# Patient Record
Sex: Female | Born: 1950 | Race: White | Hispanic: No | Marital: Married | Smoking: Never smoker
Health system: Southern US, Community
[De-identification: ages and names within clinical notes are randomized; demographics above are authoritative.]

## PROBLEM LIST (undated history)

## (undated) DIAGNOSIS — I10 Essential (primary) hypertension: Secondary | ICD-10-CM

## (undated) DIAGNOSIS — M1712 Unilateral primary osteoarthritis, left knee: Secondary | ICD-10-CM

## (undated) DIAGNOSIS — M7750 Other enthesopathy of unspecified foot: Secondary | ICD-10-CM

## (undated) DIAGNOSIS — M20019 Mallet finger of unspecified finger(s): Secondary | ICD-10-CM

## (undated) HISTORY — PX: FOOT SURGERY: SHX648

## (undated) HISTORY — DX: Unilateral primary osteoarthritis, left knee: M17.12

## (undated) HISTORY — DX: Mallet finger of unspecified finger(s): M20.019

## (undated) HISTORY — DX: Essential (primary) hypertension: I10

## (undated) HISTORY — DX: Other enthesopathy of unspecified foot and ankle: M77.50

---

## 1999-07-20 ENCOUNTER — Other Ambulatory Visit: Admission: RE | Admit: 1999-07-20 | Discharge: 1999-07-20 | Payer: Self-pay | Admitting: Obstetrics and Gynecology

## 2000-07-23 ENCOUNTER — Other Ambulatory Visit: Admission: RE | Admit: 2000-07-23 | Discharge: 2000-07-23 | Payer: Self-pay | Admitting: Obstetrics and Gynecology

## 2001-08-20 ENCOUNTER — Other Ambulatory Visit: Admission: RE | Admit: 2001-08-20 | Discharge: 2001-08-20 | Payer: Self-pay | Admitting: Obstetrics and Gynecology

## 2003-09-14 ENCOUNTER — Other Ambulatory Visit: Admission: RE | Admit: 2003-09-14 | Discharge: 2003-09-14 | Payer: Self-pay | Admitting: Obstetrics and Gynecology

## 2009-06-23 ENCOUNTER — Ambulatory Visit: Payer: Self-pay | Admitting: Sports Medicine

## 2009-06-23 DIAGNOSIS — M654 Radial styloid tenosynovitis [de Quervain]: Secondary | ICD-10-CM | POA: Insufficient documentation

## 2009-06-23 DIAGNOSIS — M25539 Pain in unspecified wrist: Secondary | ICD-10-CM | POA: Insufficient documentation

## 2009-12-08 ENCOUNTER — Ambulatory Visit: Payer: Self-pay | Admitting: Sports Medicine

## 2009-12-08 DIAGNOSIS — M719 Bursopathy, unspecified: Secondary | ICD-10-CM

## 2009-12-08 DIAGNOSIS — M67919 Unspecified disorder of synovium and tendon, unspecified shoulder: Secondary | ICD-10-CM | POA: Insufficient documentation

## 2010-07-15 ENCOUNTER — Ambulatory Visit
Admission: RE | Admit: 2010-07-15 | Discharge: 2010-07-15 | Payer: Self-pay | Source: Home / Self Care | Attending: Family Medicine | Admitting: Family Medicine

## 2010-07-15 ENCOUNTER — Encounter: Payer: Self-pay | Admitting: Family Medicine

## 2010-07-15 DIAGNOSIS — M6530 Trigger finger, unspecified finger: Secondary | ICD-10-CM | POA: Insufficient documentation

## 2010-08-02 NOTE — Assessment & Plan Note (Signed)
Summary: SHOULDER PAIN,MC   History of Present Illness: Insidious onset right shoulder pain. Worst on overhead motions, pushups, and planks. Relieved by position change, advil, and rest. Again, no inciting incident. No prior shoulder injury or procedures. No neck pain or paresthesias.  Right DeQuervain's much improved.  Wearing splint during activities such as yoga. Still some localized pain during yoga.  Allergies: No Known Drug Allergies PMH-FH-SH reviewed for relevance  Physical Exam  General:  Well-developed,well-nourished,in no acute distress; alert,appropriate and cooperative throughout examination Msk:  NEURO: (-) Spurling's bilaterally. Nl neurologic exam throughout upper extremities.  NECK: No spinal ttp. FROM without pain.  SHOULDERS: No atrophy. No swelilng or discoloration. Full ROM. Full strength. (+) R Hawkin's. (-) Empty Can. (-) Speed's/Yerguson's. No signs of labral instability or pain.  ELBOWS: Full ROM. Full strength.  WRISTS/HANDS/FINGERS: Full ROM. Full strength. 2+ rad pulses. (+) Finkelstein's on the right with decreased pain since LOV.    Impression & Recommendations:  Problem # 1:  ROTATOR CUFF SYNDROME, RIGHT (ICD-726.10)  - Daily theraband RC rehab exercises via provided handout. - Stop advil. - Start mobic. - RTC in 4 wks. - No pushups or overhead lifting. Minimize pushups.  Problem # 2:  DE QUERVAIN'S TENOSYNOVITIS (ICD-727.04) Assessment: Improved Declined corticosteroid injection.  - Start mobic. - Continue current plan. - Avoid aggravating activities/positions.  Complete Medication List: 1)  Mobic 15 Mg Tabs (Meloxicam) .Marland Kitchen.. 1 tab by mouth with food daily Prescriptions: MOBIC 15 MG TABS (MELOXICAM) 1 tab by mouth with food daily  #30 x 1   Entered and Authorized by:   Valarie Merino MD   Signed by:   Valarie Merino MD on 12/08/2009   Method used:   Print then Give to Patient   RxID:   [id number]

## 2010-08-04 NOTE — Op Note (Signed)
Summary: Consent  Consent   Imported By: Marily Memos 07/19/2010 08:54:56  _____________________________________________________________________  External Attachment:    Type:   Image     Comment:   External Document

## 2010-08-04 NOTE — Assessment & Plan Note (Signed)
Summary: TENDONITIS R WRIST,TRIGGER FINGER L HAND,MC   Vital Signs:  Patient profile:   60 year old female Height:      66 inches Weight:      125 pounds BMI:     20.25 BP sitting:   132 / 83  Vitals Entered By: Lillia Pauls CMA (July 15, 2010 9:20 AM)  History of Present Illness: 1) continued RIGHt wrist pain prev dx as de quervains. Got a littl better for a while then has returned. pain with everyday activities like throwing a stick for her labrador. No numbness. No wekaness. no new injury never had wrist or hand surgery has intermittently worn the thumb spica splint.  new problem of left thumb "sticking". Noted for first time earlier in week.no injury. No n ew activity. Right hand dominant  Current Medications (verified): 1)  Mobic 15 Mg Tabs (Meloxicam) .Marland Kitchen.. 1 Tab By Mouth With Food Daily  Allergies: No Known Drug Allergies  Review of Systems       Please see HPI for additional ROS.   Physical Exam  General:  alert, well-developed, well-nourished, and well-hydrated.   Msk:  right thumb abductor tendons ttp. FROM. normal strength. left thumb trigger seen with flexion. ttp at  mcp joint thumb Additional Exam:  Patient given informed consent for injection. Discussed possible complications of infection, bleeding or skin atrophy at site of injection. Possible side effect of avascular necrosis (focal area of bone death) due to steroid use.Appropriate verbal time out taken Are cleaned and prepped in usual sterile fashion. A --1-- cc kennalog plus --1--cc 1% lidocaine without epinephrine was injected into the--tendon sheath thumb-. Patient tolerated procedure well with no complications.  Patient given informed consent for injection. Discussed possible complications of infection, bleeding or skin atrophy at site of injection. Possible side effect of avascular necrosis (focal area of bone death) due to steroid use.Appropriate verbal time out taken Are cleaned and prepped in usual  sterile fashion. A ---1/2- cc kennalog plus 1/2----cc 1% lidocaine without epinephrine was injected into the--tendon nodule thumb flexor-. Patient tolerated procedure well with no complications.    Impression & Recommendations:  Problem # 1:  DE QUERVAIN'S TENOSYNOVITIS (ICD-727.04)  injection universal thumb splint at this time she has had signs for over a year. 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 1 tab by mouth with food daily   Orders Added: 1)  Est. Patient Level IV [09811] 2)  Joint Aspirate / Injection, Small [20600] 3)  Kenalog 10 mg inj [J3301]

## 2010-08-05 ENCOUNTER — Encounter: Payer: Self-pay | Admitting: Family Medicine

## 2010-08-05 ENCOUNTER — Ambulatory Visit: Admit: 2010-08-05 | Payer: Self-pay | Admitting: Family Medicine

## 2010-08-05 ENCOUNTER — Ambulatory Visit (INDEPENDENT_AMBULATORY_CARE_PROVIDER_SITE_OTHER): Payer: Commercial Managed Care - PPO | Admitting: Family Medicine

## 2010-08-05 DIAGNOSIS — M6530 Trigger finger, unspecified finger: Secondary | ICD-10-CM

## 2010-08-05 DIAGNOSIS — M654 Radial styloid tenosynovitis [de Quervain]: Secondary | ICD-10-CM

## 2010-08-18 NOTE — Assessment & Plan Note (Signed)
Summary: FU WRIST/FINGER/MC/MJD   Vital Signs:  Patient profile:   60 year old female BP sitting:   131 / 82  Vitals Entered By: Judge Stall (August 05, 2010 9:27 AM)  History of Present Illness: 60 yo female following up for right Dequervins and left thumb trigger finger s/p injections.  Pt states she is feeling 100 times better  1.  Derenda Mis-  Pt states she still wear her brace from time to time but it does get in the way of some ADL's.  Pt states the brace does not help her with her yoga and that she used to have another brace that was longer that helped her.   2.  Trigger finger no new problems only catches very seldomnley.  No pain no swelling, no redness.   Current Medications (verified): 1)  Mobic 15 Mg Tabs (Meloxicam) .Marland Kitchen.. 1 Tab By Mouth With Food Daily  Allergies (verified): No Known Drug Allergies  Past History:  Past medical, surgical, family and social histories (including risk factors) reviewed, and no changes noted (except as noted below).  Family History: Reviewed history from 06/23/2009 and no changes required. Mother- Arthritis of unkonwn type  Social History: Reviewed history from 06/23/2009 and no changes required. No tobacco use  Physical Exam  General:  alert, well-developed, well-nourished, and well-hydrated.   Msk:  FROM of both thumbs Right thumb, negative finklsteins no deformity NVI, FROM Left thumb trigger point/ calcification felt near base of thumb, NT, no catching appreciated FROM NVI.   Impression & Recommendations:  Problem # 1:  DE QUERVAIN'S TENOSYNOVITIS (ICD-727.04) Assessment Improved Continue splint at bedtime as needed for pain relief, given longer wrist brace with thumb spica for yoga.  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   Complete Medication List: 1)  Mobic 15 Mg Tabs (Meloxicam)  .Marland Kitchen.. 1 tab by mouth with food daily [facility]   Orders Added: 1)  Wrist splint cock up- [facility] [L3908] 2)  Est. Patient Level III [60454]     Complete Medication List: 1)  Mobic 15 Mg Tabs (Meloxicam) .Marland Kitchen.. 1 tab by mouth with food daily  [facility]

## 2011-01-24 ENCOUNTER — Other Ambulatory Visit: Payer: Self-pay | Admitting: Obstetrics & Gynecology

## 2011-01-24 DIAGNOSIS — Z1231 Encounter for screening mammogram for malignant neoplasm of breast: Secondary | ICD-10-CM

## 2011-02-22 ENCOUNTER — Ambulatory Visit (HOSPITAL_COMMUNITY)
Admission: RE | Admit: 2011-02-22 | Discharge: 2011-02-22 | Disposition: A | Discharge status: Home / Self Care | Payer: 59 | Source: Ambulatory Visit | Attending: Obstetrics & Gynecology | Admitting: Obstetrics & Gynecology

## 2011-02-22 DIAGNOSIS — Z1231 Encounter for screening mammogram for malignant neoplasm of breast: Secondary | ICD-10-CM

## 2011-07-14 ENCOUNTER — Ambulatory Visit (INDEPENDENT_AMBULATORY_CARE_PROVIDER_SITE_OTHER): Payer: 59 | Admitting: Family Medicine

## 2011-07-14 VITALS — BP 128/80

## 2011-07-14 DIAGNOSIS — M6530 Trigger finger, unspecified finger: Secondary | ICD-10-CM

## 2011-07-14 DIAGNOSIS — M65312 Trigger thumb, left thumb: Secondary | ICD-10-CM

## 2011-07-14 NOTE — Patient Instructions (Signed)
Thumb spica alleve 2tab po bid for 7 days Warm hand soaks Ice massage F/u 08/04/11. If not better cortisone injection will be done

## 2011-07-14 NOTE — Progress Notes (Signed)
  Subjective:    Patient ID: Sonya Luna, female    DOB: 02/07/1951, 61 y.o.   MRN: [id number]  HPI  Ms. Dimiceli is coming to f/u on her left trigger thumb. She had a cortisone injection last year that lasted for a year. She is having mild snapping of her flexor tendon with flexion and extension but is mild. She os not having really pain but mild discomfort. She was wondering if she needed to repeat another cortisone injection at the present time. Her right wrist de Quervain's  Tenosynovitis has a resolved.  Patient Active Problem List  Diagnoses  . WRIST PAIN, RIGHT  . ROTATOR CUFF SYNDROME, RIGHT  . DE QUERVAIN'S TENOSYNOVITIS  . TRIGGER FINGER, LEFT THUMB   No current outpatient prescriptions on file prior to visit.   Allergies no known allergies   Review of Systems  Constitutional: Negative for fever, chills, diaphoresis and fatigue.  Musculoskeletal: Negative for back pain, joint swelling, arthralgias and gait problem.  Neurological: Negative for weakness and numbness.       Objective:   Physical Exam  Constitutional: She is oriented to person, place, and time. She appears well-developed and well-nourished.       BP 128/80   Pulmonary/Chest: Effort normal.  Musculoskeletal:       Left with intact skin. No swelling of the thumb. This mild snapping sensation with flexion and extension at the A1 pulley corresponding with flexor tendon tenosynovitis. Neurovascularly intact. For range of motion of the first left MPJ  Neurological: She is alert and oriented to person, place, and time.  Skin: Skin is warm. No rash noted. No erythema.  Psychiatric: She has a normal mood and affect. Her behavior is normal.          Assessment & Plan:   1. Trigger thumb of left hand    Thumb spica alleve 2tab po bid for 7 days Warm hand soaks Ice massage F/u 08/04/11. If not better cortisone injection will be done

## 2011-08-04 ENCOUNTER — Ambulatory Visit (INDEPENDENT_AMBULATORY_CARE_PROVIDER_SITE_OTHER): Payer: 59 | Admitting: Family Medicine

## 2011-08-04 VITALS — BP 120/80

## 2011-08-04 DIAGNOSIS — M6530 Trigger finger, unspecified finger: Secondary | ICD-10-CM

## 2011-08-04 NOTE — Progress Notes (Signed)
  Subjective:    Patient ID: Sonya Luna, female    DOB: [birth year], 61 y.o.   MRN: [id number]  HPI  Followup left trigger thumb. Last year we injected it and she had significant resolution until about a month ago. Acutely her symptoms came back. Was seen here and started on exercises and thumb splint. If anything her symptoms seem worse. Would like to consider steroid injection today.  Review of Systems    denies fever, sweats, chills. Has seen no redness or erythema of the left thumb. Objective:   Physical Exam   Vital signs reviewed. GENERAL: Well developed, well nourished, no acute distress THUMB: Definite triggering of the flexor tendon at the MCP joint. She is neurovascularly intact in her hands. Bilaterally her hand exam is otherwise normal.     INJECTION: Patient was given informed consent, signed copy in the chart. Appropriate time out was taken. Area prepped and draped in usual sterile fashion. 1/2 cc of methylprednisolone 40 mg/ml plus  1/2 cc of 1% lidocaine without epinephrine was injected into the sheath of thumb tendon using a(n) volar approach. The patient tolerated the procedure well. There were no complications. Post procedure instructions were given.  Assessment & Plan:  Trigger thumb. Corticosteroid injection. If this does not help then I would probably recommend orthopedic evaluation. If this helps for significant amount nothing will both be happy

## 2012-01-23 ENCOUNTER — Other Ambulatory Visit: Payer: Self-pay | Admitting: Obstetrics & Gynecology

## 2012-01-23 DIAGNOSIS — Z1231 Encounter for screening mammogram for malignant neoplasm of breast: Secondary | ICD-10-CM

## 2012-03-13 ENCOUNTER — Ambulatory Visit (HOSPITAL_COMMUNITY)
Admission: RE | Admit: 2012-03-13 | Discharge: 2012-03-13 | Disposition: A | Discharge status: Home / Self Care | Payer: 59 | Source: Ambulatory Visit | Attending: Obstetrics & Gynecology | Admitting: Obstetrics & Gynecology

## 2012-03-13 DIAGNOSIS — Z1231 Encounter for screening mammogram for malignant neoplasm of breast: Secondary | ICD-10-CM | POA: Insufficient documentation

## 2012-07-15 ENCOUNTER — Ambulatory Visit (INDEPENDENT_AMBULATORY_CARE_PROVIDER_SITE_OTHER): Payer: 59 | Admitting: Family Medicine

## 2012-07-15 VITALS — BP 151/85 | Ht 66.0 in | Wt 126.0 lb

## 2012-07-15 DIAGNOSIS — IMO0001 Reserved for inherently not codable concepts without codable children: Secondary | ICD-10-CM

## 2012-07-15 DIAGNOSIS — M20019 Mallet finger of unspecified finger(s): Secondary | ICD-10-CM

## 2012-07-15 NOTE — Progress Notes (Signed)
  Subjective:    Patient ID: Sonya Luna, female    DOB: [birth year], 62 y.o.   MRN: [id number]  HPI  Right long finger injury this morning as she was trying to put on a pair of boots. Felt a small pop and now can no longer straighten the knee and part of her finger. No numbness. No prior problems with this finger.  Review of Systems No Sully pain in the joint, no erythema, no numbness.    Objective:   Physical Exam  Vital signs are reviewed GENERAL: Well-developed female no acute distress HAND: Right. Index finger DIP is in flexion. She can not fully extend at the DIP. ; Small evulsion seen of the extensor tendon.      Assessment & Plan:  #1. Extensor tendon injury right long finger. We placed her in a stack splint and gave her instructions on 6-8 weeks of extension.  At 6 weeks she can evaluate herself her return to clinic and will be happy to evaluate for her.Marland Kitchen

## 2013-02-18 ENCOUNTER — Other Ambulatory Visit: Payer: Self-pay | Admitting: Obstetrics & Gynecology

## 2013-02-18 DIAGNOSIS — Z1231 Encounter for screening mammogram for malignant neoplasm of breast: Secondary | ICD-10-CM

## 2013-03-26 ENCOUNTER — Ambulatory Visit (HOSPITAL_COMMUNITY)
Admission: RE | Admit: 2013-03-26 | Discharge: 2013-03-26 | Disposition: A | Discharge status: Home / Self Care | Payer: 59 | Source: Ambulatory Visit | Attending: Obstetrics & Gynecology | Admitting: Obstetrics & Gynecology

## 2013-03-26 ENCOUNTER — Other Ambulatory Visit: Payer: Self-pay | Admitting: Obstetrics & Gynecology

## 2013-03-26 DIAGNOSIS — Z1231 Encounter for screening mammogram for malignant neoplasm of breast: Secondary | ICD-10-CM | POA: Insufficient documentation

## 2013-03-31 ENCOUNTER — Other Ambulatory Visit: Payer: Self-pay | Admitting: Obstetrics & Gynecology

## 2013-03-31 DIAGNOSIS — N63 Unspecified lump in unspecified breast: Secondary | ICD-10-CM

## 2013-04-11 ENCOUNTER — Ambulatory Visit
Admission: RE | Admit: 2013-04-11 | Discharge: 2013-04-11 | Disposition: A | Discharge status: Home / Self Care | Payer: 59 | Source: Ambulatory Visit | Attending: Obstetrics & Gynecology | Admitting: Obstetrics & Gynecology

## 2013-04-11 DIAGNOSIS — N63 Unspecified lump in unspecified breast: Secondary | ICD-10-CM

## 2013-08-20 ENCOUNTER — Ambulatory Visit: Payer: Self-pay | Admitting: Podiatry

## 2013-08-20 ENCOUNTER — Ambulatory Visit (INDEPENDENT_AMBULATORY_CARE_PROVIDER_SITE_OTHER): Payer: 59

## 2013-08-20 ENCOUNTER — Ambulatory Visit (INDEPENDENT_AMBULATORY_CARE_PROVIDER_SITE_OTHER): Payer: 59 | Admitting: Podiatry

## 2013-08-20 ENCOUNTER — Encounter: Payer: Self-pay | Admitting: Podiatry

## 2013-08-20 VITALS — BP 138/91 | HR 88 | Resp 16

## 2013-08-20 DIAGNOSIS — M2020 Hallux rigidus, unspecified foot: Secondary | ICD-10-CM

## 2013-08-21 NOTE — Progress Notes (Signed)
Subjective:     Patient ID: Sonya Luna, female   DOB: [birth year], 63 y.o.   MRN: [id number]  HPI patient presents stating my left big toe joint is starting to bother me more and I know I will probably need surgery on it. My right is doing well he does not been that much but there is no pain or deformity   Review of Systems     Objective:   Physical Exam Neurovascular status intact with no change in patient's health history since last visit. Muscle strength adequate and no equinus condition and good Fill time is noted to all digits on both feet. First MPJ left shows diminishment range of motion with approximate 15-20 of dorsiflexion 10 of plantar flexion with obvious spurs dorsal and medial on the first metatarsal head. 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 Discussed the condition on the left foot and she would like a hallux limitus repair. I did discuss my concerns because of the right one whether or not this one will do well with biplanar osteotomy but she is satisfied with the right one and would like the same procedure she had gone on back foot. Patient reviewed a biplanar Austin osteotomy with pin fixation and reviewed all complications as outlined in the consent form. She understands total recovery from procedure such as this is approximate 6 months to one year and she will be in a boot for the first week to 10 days followed by surgical shoe usage for another 3-4 weeks. Patient had air fracture walker dispensed for left and signed consent form after extensive review and is scheduled for surgery next week due to her schedule

## 2014-03-02 ENCOUNTER — Other Ambulatory Visit: Payer: Self-pay | Admitting: Obstetrics & Gynecology

## 2014-03-02 DIAGNOSIS — Z0000 Encounter for general adult medical examination without abnormal findings: Secondary | ICD-10-CM

## 2014-03-02 DIAGNOSIS — Z1231 Encounter for screening mammogram for malignant neoplasm of breast: Secondary | ICD-10-CM

## 2014-04-01 ENCOUNTER — Ambulatory Visit (HOSPITAL_COMMUNITY): Payer: 59

## 2014-04-08 ENCOUNTER — Ambulatory Visit (HOSPITAL_COMMUNITY)
Admission: RE | Admit: 2014-04-08 | Discharge: 2014-04-08 | Disposition: A | Discharge status: Home / Self Care | Payer: 59 | Source: Ambulatory Visit | Attending: Obstetrics & Gynecology | Admitting: Obstetrics & Gynecology

## 2014-04-08 DIAGNOSIS — Z1231 Encounter for screening mammogram for malignant neoplasm of breast: Secondary | ICD-10-CM | POA: Insufficient documentation

## 2014-05-12 ENCOUNTER — Encounter: Payer: Self-pay | Admitting: Sports Medicine

## 2014-05-12 ENCOUNTER — Ambulatory Visit (INDEPENDENT_AMBULATORY_CARE_PROVIDER_SITE_OTHER): Payer: 59 | Admitting: Sports Medicine

## 2014-05-12 VITALS — BP 153/93 | HR 60 | Ht 66.0 in | Wt 130.0 lb

## 2014-05-12 DIAGNOSIS — S46811A Strain of other muscles, fascia and tendons at shoulder and upper arm level, right arm, initial encounter: Secondary | ICD-10-CM

## 2014-05-12 DIAGNOSIS — M75111 Incomplete rotator cuff tear or rupture of right shoulder, not specified as traumatic: Secondary | ICD-10-CM | POA: Insufficient documentation

## 2014-05-12 DIAGNOSIS — M7521 Bicipital tendinitis, right shoulder: Secondary | ICD-10-CM

## 2014-05-12 MED ORDER — NITROGLYCERIN 0.2 MG/HR TD PT24: MEDICATED_PATCH | TRANSDERMAL | Status: DC

## 2014-05-12 NOTE — Assessment & Plan Note (Signed)
Less than 50% tear, 2 microcalcifications seen in tendon. -Start NTG protocol -Therapeutic exercises -Follow-up in 6 weeks for re-US or sooner if needed.

## 2014-05-12 NOTE — Progress Notes (Signed)
   Subjective:    Patient ID: Sonya Luna, female    DOB: Aug 21, 1950, 63 y.o.   MRN: [id number]  HPI [name] is a 63 year old right-hand-dominant female who presents with right shoulder pain. Onset of symptoms was 2 days ago when she was standing to start her lawnmower and she felt sudden pain located in the anterior right shoulder. Symptoms are aggravated with reaching above shoulder level or reaching behind her. She has tried rest, ice, and Aleve with some relief of symptoms. She denies any pop in the right shoulder, weakness, or numbness. She has had similar episodes in the remote past, which all resolved with some rest and gentle exercises. She denies any associated swelling, bruising, warmth, or fevers. She is usually fairly active with exercise classes and yoga.  Past medical history, social history, medications, and allergies were reviewed and are up to date in the chart.  rReview of Systems 7 point review of systems was performed and was otherwise negative unless noted in the history of present illness.     Objective:   Physical Exam BP 153/93 mmHg  Pulse 60  Ht 5\' 6"  (1.676 m)  Wt 130 lb (58.968 kg)  BMI 20.99 kg/m2 GEN: The patient is well-developed well-nourished female and in no acute distress.  She is awake alert and oriented x3. SKIN: warm and well-perfused, no rash  Neuro: Strength 5/5 globally. Sensation intact throughout. No focal deficits. Vasc: +2 bilateral distal pulses. No edema.  MSK:  Examination of the right shoulder reveals forward flexion and abduction to 90 with pain at the endpoints of motion. She has internal rotation and extension to the level of T10. Rotator cuff testing reveals weakness with Jobes testing, and speeds test. She also has positive impingement signs. Negative abdominal compression test. No tenderness at the a.c. Joint. She has no Popeye deformity. She is neurovascularly intact distally.  Limited musculoskeletal ultrasound: long and  short axis views were obtained of the right shoulder. There is a hypoechoic fluid collection surrounding the biceps tendon, but no frank tear is seen in the tendon itself. The tendon was able to be tracked proximally into the glenohumeral joint. Examination of the subscapularis, infraspinatus, and teres major were otherwise normal. She does have a 40% partial thickness tear of her supraspinatus with 2 microcalcifications seen overlying the foot plate and surrounding fluid edema.      Assessment & Plan:  Please see problem based assessment and plan in the problem list.

## 2014-05-12 NOTE — Assessment & Plan Note (Signed)
-  With hypoechoic fluid collection. Spur seen at humeral head where biceps tracks as well. -Start NTG protocol -Therapeutic exercises -Follow-up in 6 weeks for re-US or sooner if needed.

## 2014-05-12 NOTE — Patient Instructions (Signed)
Dx: Partial thickness biceps tendon tear with fluid around it. Also, possibly previous partial supraspinatus tear with 2 microcalcifications seen on ultrasound.  Nitroglycerin Protocol   Apply 1/4 nitroglycerin patch to affected area daily.  Change position of patch within the affected area every 24 hours.  You may experience a headache during the first 1-2 weeks of using the patch, these should subside.  If you experience headaches after beginning nitroglycerin patch treatment, you may take your preferred over the counter pain reliever.  Another side effect of the nitroglycerin patch is skin irritation or rash related to patch adhesive.  Please notify our office if you develop more severe headaches or rash, and stop the patch.  Tendon healing with nitroglycerin patch may require 12 to 24 weeks depending on the extent of injury.  Men should not use if taking Viagra, Cialis, or Levitra.   Do not use if you have migraines or rosacea.   -Start Nitroglycerin patch once daily -Exercises:  1. "Lawnmower" rows, with no more than 3-5lb weight. 10-15 reps x 2-3 sets once daily  2. Spokes on a wheel from 0-90 degrees 10-15 reps x 2-3 sets once daily.  3. Palm up/Palm down with 3-5lb weight with elbow against your side and elbow flexed to 90 degrees. -Rest, ice, Aleve if needed -Follow-up in 6 weeks for repeat US and recheck or sooner if needed.

## 2014-06-23 ENCOUNTER — Ambulatory Visit (INDEPENDENT_AMBULATORY_CARE_PROVIDER_SITE_OTHER): Payer: 59 | Admitting: Sports Medicine

## 2014-06-23 ENCOUNTER — Encounter: Payer: Self-pay | Admitting: Sports Medicine

## 2014-06-23 VITALS — BP 142/95 | HR 73 | Ht 66.0 in | Wt 130.0 lb

## 2014-06-23 DIAGNOSIS — M75111 Incomplete rotator cuff tear or rupture of right shoulder, not specified as traumatic: Secondary | ICD-10-CM

## 2014-06-23 DIAGNOSIS — M7521 Bicipital tendinitis, right shoulder: Secondary | ICD-10-CM

## 2014-06-23 NOTE — Assessment & Plan Note (Signed)
This looks improved on scanning and exam  Will cont HEP

## 2014-06-23 NOTE — Assessment & Plan Note (Signed)
Mild improvement. Exam similar today with improved ROM, but persistent weakness due to pain. US demonstrates more involved tearing of rotator cuff with partial tearing involvement of 3 muscles: supraspinatus, subscapularis, and teres minor. Biceps tendonitis mostly resolved.  Plan:  1. Continue current conservative rehab - half NTG patches, gentle exercises with light curls and forearm rolls (limit ROM flexion to 45 to 90*, avoid pain)  2. Anticipate total rehab / recovery 3 to 6 months given multiple muscles involved  3. Avoid future str training with kettle bell and advise against pushing through similar pain if returns to str training classes  4. RTC 6 weeks re-evaluation

## 2014-06-23 NOTE — Progress Notes (Signed)
Subjective:    Patient ID: Sonya Luna, female    DOB: 1950/12/09, 63 y.o.   MRN: [id number]  HPI  RIGHT SHOULD PAIN / ROTATOR CUFF: - Patient presents for a follow-up for same complaint, last [facility] visit 05/12/14, at that time diagnosed with partial tear rotator cuff tear (<50% of supraspinatus) and biceps tendinitis on [place], given NTG patches, exercises. - Today (approx 6 weeks later) patient with some improvement, but complains of persistent pain and limitation with activity without overall significant resolution. Admits to pain even with lifting coffee cup with Right arm (needs to use left hand to support), some improved motion but pain worse with lifting arm forward over head, continued to use NTG patches inc from quarter to half patch, continued exercises. Discussed initial injury, believes most likely related to strength training class with chest presses few days before actual acute pain when started lawnmower, admits she has been "pushing through pain" with the class for a while, including exercises with kettle bell - Denies any numbness, tingling, worsening pain Note almost all of her night pain has resolved Ability to lift things to shoulder height is better but not higher than that  I have reviewed and updated the following as appropriate: allergies and current medications  Social Hx: - never smoker  Review of Systems  See above HPI    Objective:   Physical Exam  BP 142/95 mmHg  Pulse 73  Ht 5\' 6"  (1.676 m)  Wt 130 lb (58.968 kg)  BMI 20.99 kg/m2  Gen - well-appearing, NAD MSK: - Right Shoulder: appearance normal and symmetrical to Left, no effusions or edema, no obvious deformities, mostly full active ROM with some slight limitation in forward flexion >100* due to pain otherwise abduction/flexion normal, internal and ext rotation normal. Biceps tendon testing with speed's / yergason's negative - good strength but she does feel some pain, rotator cuff testing with empty  can (positive), hawkin's impingement (positive) causing pain with mild resistance ER causes pain x resistance but is strong IR without pain Scapular function good Ext - non-tender, no edema, peripheral pulses intact +2 b/l Skin - warm, dry Neuro - intact distal sensation to light touch  Bedside MSK Ultrasound: - Right Shoulder: Biceps tendon appears to be mostly healed now with minimal edema surrounding tendon. Rotator cuff muscles with persistent partial tearing, supraspinatus tear with 3 areas of distinct hypoechoic change but no retraction of tendon with motion additionally partial tears of subscapularis and infraspinatus again without displacement or disrupted mechanics on movement/impingement testing. These tears are only seen at distal foot plate. AC joint with mild degenerative changes.     Assessment & Plan:   74 yr F without significant PMH presents for follow-up for Right shoulder pain with partial rotator cuff tear. Currently 6 weeks after starting NTG and rehab therapy, mild improvements. Exam similar today with improved ROM, but persistent weakness due to pain. US demonstrates more involved tearing of rotator cuff with partial tearing involvement of 3 muscles: supraspinatus, subscapularis, and infraspinatus. Biceps tendonitis mostly resolved.  Plan: 1. Continue current conservative rehab - half NTG patches, gentle exercises with light curls and forearm rolls (limit ROM flexion to 45 to 90*, avoid pain) 2. Anticipate total rehab / recovery 3 to 6 months given multiple muscles involved 3. Avoid future str training with kettle bell and advise against pushing through similar pain if returns to str training classes 4. RTC 6 weeks re-evaluation  Nobie Putnam, [place], PGY-2  Agree with plan and evaluation.   Ila Mcgill, MD

## 2014-07-15 ENCOUNTER — Ambulatory Visit (INDEPENDENT_AMBULATORY_CARE_PROVIDER_SITE_OTHER): Payer: 59

## 2014-07-15 ENCOUNTER — Ambulatory Visit (INDEPENDENT_AMBULATORY_CARE_PROVIDER_SITE_OTHER): Payer: 59 | Admitting: Podiatry

## 2014-07-15 ENCOUNTER — Encounter: Payer: Self-pay | Admitting: Podiatry

## 2014-07-15 VITALS — BP 140/98 | HR 70 | Resp 16

## 2014-07-15 DIAGNOSIS — M205X2 Other deformities of toe(s) (acquired), left foot: Secondary | ICD-10-CM

## 2014-07-15 NOTE — Patient Instructions (Signed)
Pre-Operative Instructions  Congratulations, you have decided to take an important step to improving your quality of life.  You can be assured that the doctors of [facility] will be with you every step of the way.  1. Plan to be at the surgery center/hospital at least 1 (one) hour prior to your scheduled time unless otherwise directed by the surgical center/hospital staff.  You must have a responsible adult accompany you, remain during the surgery and drive you home.  Make sure you have directions to the surgical center/hospital and know how to get there on time. 2. For hospital based surgery you will need to obtain a history and physical form from your family physician within 1 month prior to the date of surgery- we will give you a form for you primary physician.  3. We make every effort to accommodate the date you request for surgery.  There are however, times where surgery dates or times have to be moved.  We will contact you as soon as possible if a change in schedule is required.   4. No Aspirin/Ibuprofen for one week before surgery.  If you are on aspirin, any non-steroidal anti-inflammatory medications (Mobic, Aleve, Ibuprofen) you should stop taking it 7 days prior to your surgery.  You make take Tylenol  For pain prior to surgery.  5. Medications- If you are taking daily heart and blood pressure medications, seizure, reflux, allergy, asthma, anxiety, pain or diabetes medications, make sure the surgery center/hospital is aware before the day of surgery so they may notify you which medications to take or avoid the day of surgery. 6. No food or drink after midnight the night before surgery unless directed otherwise by surgical center/hospital staff. 7. No alcoholic beverages 24 hours prior to surgery.  No smoking 24 hours prior to or 24 hours after surgery. 8. Wear loose pants or shorts- loose enough to fit over bandages, boots, and casts. 9. No slip on shoes, sneakers are best. 10. Bring  your boot with you to the surgery center/hospital.  Also bring crutches or a walker if your physician has prescribed it for you.  If you do not have this equipment, it will be provided for you after surgery. 11. If you have not been contracted by the surgery center/hospital by the day before your surgery, call to confirm the date and time of your surgery. 12. Leave-time from work may vary depending on the type of surgery you have.  Appropriate arrangements should be made prior to surgery with your employer. 13. Prescriptions will be provided immediately following surgery by your doctor.  Have these filled as soon as possible after surgery and take the medication as directed. 14. Remove nail polish on the operative foot. 15. Wash the night before surgery.  The night before surgery wash the foot and leg well with the antibacterial soap provided and water paying special attention to beneath the toenails and in between the toes.  Rinse thoroughly with water and dry well with a towel.  Perform this wash unless told not to do so by your physician.  Enclosed: 1 Ice pack (please put in freezer the night before surgery)   1 Hibiclens skin cleaner   Pre-op Instructions  If you have any questions regarding the instructions, do not hesitate to call our office.  [place]: [address] [place], [place] [address] [phone]  [place]: [address] [place], [place] [address] [phone]  [place]: [address]  [place], [place] [address] [phone]  Dr. Richard   Tuchman DPM, Dr. Norman Regal DPM Dr. Richard Sikora DPM, Dr. M. Todd Hyatt DPM, Dr. Kathryn Egerton DPM 

## 2014-07-15 NOTE — Progress Notes (Signed)
Subjective:     Patient ID: Sonya Luna, female   DOB: [birth year], 64 y.o.   MRN: [id number]  HPI patient presents stating I am ready to get this left foot fixed and my right one no longer hurts but there is no motion   Review of Systems     Objective:   Physical Exam Neurovascular status is intact with muscle strength adequate and range of motion subtalar and midtarsal joint within normal limits. 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 She wants surgery and at this time she is scheduled for outpatient surgery for biplanar osteotomy and is given the consent form which she read and then signs reviewing all possible complications as listed. Understanding total recovery can take 6 months to one year and possibility exists for fusion or implantation at one point in the future

## 2014-07-29 ENCOUNTER — Encounter: Payer: Self-pay | Admitting: Podiatry

## 2014-07-29 DIAGNOSIS — M2022 Hallux rigidus, left foot: Secondary | ICD-10-CM

## 2014-07-29 DIAGNOSIS — M2042 Other hammer toe(s) (acquired), left foot: Secondary | ICD-10-CM

## 2014-07-31 ENCOUNTER — Telehealth: Payer: Self-pay | Admitting: *Deleted

## 2014-07-31 NOTE — Telephone Encounter (Addendum)
Pt called states she had surgery on 07/29/2014, and she has noticed blood on her dressing.  Left message to call our office again.  I spoke with pt, she stated she had noticed bit of dried blood on the outer dressing of her surgery foot.  I informed the pt, if it were dry and no larger than a $ 0.50, it was okay.  I encouraged pt to call if she sees bright red, fresh blood or concerns.  Pt agreed.

## 2014-08-04 ENCOUNTER — Encounter: Payer: Self-pay | Admitting: Sports Medicine

## 2014-08-04 ENCOUNTER — Ambulatory Visit (INDEPENDENT_AMBULATORY_CARE_PROVIDER_SITE_OTHER): Payer: 59 | Admitting: Sports Medicine

## 2014-08-04 VITALS — BP 147/84 | HR 60 | Ht 66.0 in | Wt 130.0 lb

## 2014-08-04 DIAGNOSIS — M75111 Incomplete rotator cuff tear or rupture of right shoulder, not specified as traumatic: Secondary | ICD-10-CM

## 2014-08-04 NOTE — Progress Notes (Signed)
   Subjective:    Patient ID: Sonya Luna, female    DOB: 1950/12/14, 64 y.o.   MRN: [id number]  HPI Sonya Luna is a 64 year-old right-hand dominant female who presents for follow-up of right shoulder pain. When she was last seen about 6 weeks ago, an ultrasound found partial thickness tears in the posterior supraspinatus, infraspinatus, and subscapularis. She has weaned off of the nitroglycerin patch completely. She has been doing a HEP. Overall shoulder significantly improved. Occasionally feels a sharp supero-lateral pain when she abducts the arm above shoulder height. She has not returned to her kettle bell exercise class yet. She denies any neck pain or radicular symptoms.  Past medical history, social history, medications, and allergies were reviewed and are up to date in the chart.  Review of Systems 7 point review of systems was performed and was otherwise negative unless noted in the history of present illness.     Objective:   Physical Exam BP 147/84 mmHg  Pulse 60  Ht 5\' 6"  (1.676 m)  Wt 130 lb (58.968 kg)  BMI 20.99 kg/m2 GEN: The patient is well-developed well-nourished female and in no acute distress.  She is awake alert and oriented x3. SKIN: warm and well-perfused, no rash  Neuro: Strength 5/5 globally. Sensation intact throughout. DTRs 2/4 bilaterally. No focal deficits. Vasc: +2 bilateral distal pulses. No edema.  MSK: Examination of the right shoulder reveals grossly full ROM in all planes. Mild TTP at supraspinatus insertion. Negative Jobe's. Negative Speed's. Negative Hawkins. Negative abdominal compression test. No tenderness at [facility]. No apprehension.  Limited US: Interval improvement in subscapularis, supraspinatus and infraspinatus partial thickness tears. Microcalcifications seen in supraspinatus tendon. No bursitis. BT appears normal.     Assessment & Plan:  Please see problem based assessment and plan in the problem list.

## 2014-08-04 NOTE — Assessment & Plan Note (Signed)
Interval improvement in symptoms and sonographically. Still with microcalcificaiton and healing partial thickness supraspinatus tear. Also, healing subscapularis and infraspinatus tears. -Plan to gradually advance exercises, partially lift restrictions, but emphasize gradual return to activities as pain permits at a level < 5.  -Plan follow-up in about 6-8 weeks or sooner if needed.

## 2014-08-05 ENCOUNTER — Ambulatory Visit (INDEPENDENT_AMBULATORY_CARE_PROVIDER_SITE_OTHER): Payer: 59

## 2014-08-05 ENCOUNTER — Ambulatory Visit (INDEPENDENT_AMBULATORY_CARE_PROVIDER_SITE_OTHER): Payer: 59 | Admitting: Podiatry

## 2014-08-05 DIAGNOSIS — M2012 Hallux valgus (acquired), left foot: Secondary | ICD-10-CM

## 2014-08-05 DIAGNOSIS — M205X2 Other deformities of toe(s) (acquired), left foot: Secondary | ICD-10-CM

## 2014-08-05 NOTE — Progress Notes (Signed)
Subjective:     Patient ID: Sonya Luna, female   DOB: 01-21-1951, 64 y.o.   MRN: [id number]  HPI patient states I'm doing real well with my left foot with minimal discomfort or swelling noted   Review of Systems     Objective:   Physical Exam Neurovascular status intact with well-healing surgical site first metatarsal left with wound edges well coapted and good range of motion in both dorsi and plantarflexion with no crepitus within the joint and no drainage note dod    Assessment:     Doing well post biplanar osteotomy first metatarsal left and also digital procedure second left.    Plan:     Dispensed fully functioning ankle brace with a plantarflexed  brace for the big toe in order to increase the range of motion and allow for faster healing. Reappoint in 4 weeks or earlier if necessary

## 2014-08-17 NOTE — Progress Notes (Signed)
Dr Paulla Dolly performed a right Austin bunionectomy on 1.27.16

## 2014-08-20 ENCOUNTER — Encounter: Payer: Self-pay | Admitting: Obstetrics & Gynecology

## 2014-08-21 ENCOUNTER — Ambulatory Visit (INDEPENDENT_AMBULATORY_CARE_PROVIDER_SITE_OTHER): Payer: 59 | Admitting: Obstetrics & Gynecology

## 2014-08-21 ENCOUNTER — Encounter: Payer: Self-pay | Admitting: Obstetrics & Gynecology

## 2014-08-21 VITALS — BP 106/68 | HR 68 | Resp 16 | Ht 65.5 in | Wt 126.2 lb

## 2014-08-21 DIAGNOSIS — Z124 Encounter for screening for malignant neoplasm of cervix: Secondary | ICD-10-CM

## 2014-08-21 DIAGNOSIS — Z01419 Encounter for gynecological examination (general) (routine) without abnormal findings: Secondary | ICD-10-CM

## 2014-08-21 NOTE — Progress Notes (Addendum)
64 y.o. Sonya Luna MarriedCaucasianF here for annual exam.   No vaginal bleeding.  Doing very well.  Has grandson that will be 1 in May.  Has been almost two years since she has been here.    PCP:  Dr. Joylene Draft.  Appt is in August.  Patient's last menstrual period was 07/03/2002.          Sexually active: Yes.    The current method of family planning is vasectomy.    Exercising: Yes.    yoga, walking, and cycling Smoker:  no  Health Maintenance: Pap:  08/16/11 WNL/negative HR HPV History of abnormal Pap:  no MMG:  04/08/14 3D-normal Colonoscopy:  2015-repeat in 10 years Dr Oletta Lamas BMD:   9/08 TDaP:  Dr Joylene Draft Screening Labs: Dr Joylene Draft, Hb today: Dr Joylene Draft, Urine today: Dr Joylene Draft   reports that she has never smoked. She has never used smokeless tobacco. She reports that she drinks about 1.2 oz of alcohol per week. She reports that she does not use illicit drugs.  Past Medical History  Diagnosis Date  . Mallet finger     middle finger-right hand  . Bone spur of foot     right foot    Past Surgical History  Procedure Laterality Date  . Foot surgery      outpatient-for bone spur right foot  . Foot surgery      for bone spur-left foot    Current Outpatient Prescriptions  Medication Sig Dispense Refill  . calcium carbonate 200 MG capsule Take 250 mg by mouth 2 (two) times daily with a meal.    . VITAMIN D, CHOLECALCIFEROL, PO Take 2,000 Int'l Units by mouth.     No current facility-administered medications for this visit.    Family History  Problem Relation Age of Onset  . Diabetes Maternal Grandfather   . Cervical cancer Paternal Grandmother   . Heart attack Father     smoker-had heart surgery    ROS:  Pertinent items are noted in HPI.  Otherwise, a comprehensive ROS was negative.  Exam:   BP 106/68 mmHg  Pulse 68  Resp 16  Ht 5' 5.5" (1.664 m)  Wt 126 lb 3.2 oz (57.244 kg)  BMI 20.67 kg/m2  LMP 07/03/2002  Weight change: -2#  Height: 5' 5.5" (166.4 cm)  Ht Readings  from Last 3 Encounters:  08/21/14 5' 5.5" (1.664 m)  08/04/14 5\' 6"  (1.676 m)  06/23/14 5\' 6"  (1.676 m)    General appearance: alert, cooperative and appears stated age Head: Normocephalic, without obvious abnormality, atraumatic Neck: no adenopathy, supple, symmetrical, trachea midline and thyroid normal to inspection and palpation Lungs: clear to auscultation bilaterally Breasts: normal appearance, no masses or tenderness Heart: regular rate and rhythm Abdomen: soft, non-tender; bowel sounds normal; no masses,  no organomegaly Extremities: extremities normal, atraumatic, no cyanosis or edema Skin: Skin color, texture, turgor normal. No rashes or lesions Lymph nodes: Cervical, supraclavicular, and axillary nodes normal. No abnormal inguinal nodes palpated Neurologic: Grossly normal   Pelvic: External genitalia:  no lesions              Urethra:  normal appearing urethra with no masses, tenderness or lesions              Bartholins and Skenes: normal                 Vagina: normal appearing vagina with normal color and discharge, no lesions  Cervix: no lesions              Pap taken: Yes.   Bimanual Exam:  Uterus:  normal size, contour, position, consistency, mobility, non-tender              Adnexa: normal adnexa and no mass, fullness, tenderness               Rectovaginal: Confirms               Anus:  normal sphincter tone, no lesions  Chaperone was present for exam.  A:  Well Woman with normal exam PMP, No HRT Mild SUI  P:   Mammogram yearly pap smear today.  Neg HR HPV 2013. Release of records for colonoscopy with Dr. Oletta Lamas and labs/vaccines with Dr. Joylene Draft. return annually or prn  09/07/14.  Received colonoscopy report and labs/vaccines from Dr. Joylene Draft.  TDap is overdue.

## 2014-08-25 LAB — IPS PAP TEST WITH REFLEX TO HPV

## 2014-09-01 ENCOUNTER — Ambulatory Visit (INDEPENDENT_AMBULATORY_CARE_PROVIDER_SITE_OTHER): Payer: 59

## 2014-09-01 ENCOUNTER — Encounter: Payer: Self-pay | Admitting: Podiatry

## 2014-09-01 ENCOUNTER — Ambulatory Visit (INDEPENDENT_AMBULATORY_CARE_PROVIDER_SITE_OTHER): Payer: 59 | Admitting: Podiatry

## 2014-09-01 VITALS — BP 151/80 | HR 66 | Resp 18

## 2014-09-01 DIAGNOSIS — M2012 Hallux valgus (acquired), left foot: Secondary | ICD-10-CM

## 2014-09-01 DIAGNOSIS — M205X2 Other deformities of toe(s) (acquired), left foot: Secondary | ICD-10-CM

## 2014-09-01 NOTE — Progress Notes (Signed)
Subjective:     Patient ID: Sonya Luna, female   DOB: 09/01/1950, 64 y.o.   MRN: [id number]  HPI patient states she's doing very well with her left foot with able to wear shoes are ready and minimal pain and good range of motion   Review of Systems     Objective:   Physical Exam  Neurovascular status intact muscle strength adequate with excellent motion of the first MPJ with approximate 25 dorsiflexion 20 plantar flexion with no pain or crepitus within the joint noted with palpation. Mild edema as she has been very active    Assessment:     Doing well post hallux limitus repair left and digital procedure second left    Plan:     Reviewed x-rays and instructed on the continuation of exercises and not to do any jumping on the foot but to increase activity. Reappoint to recheck in 4 weeks and we'll see earlier if any issues should occur

## 2014-09-02 ENCOUNTER — Encounter: Payer: 59 | Admitting: Podiatry

## 2014-09-07 NOTE — Addendum Note (Signed)
Addended by: Megan Salon on: 09/07/2014 06:13 PM   Modules accepted: Miquel Dunn

## 2014-09-28 ENCOUNTER — Ambulatory Visit (INDEPENDENT_AMBULATORY_CARE_PROVIDER_SITE_OTHER): Payer: 59

## 2014-09-28 ENCOUNTER — Ambulatory Visit (INDEPENDENT_AMBULATORY_CARE_PROVIDER_SITE_OTHER): Payer: 59 | Admitting: Podiatry

## 2014-09-28 VITALS — BP 119/79 | HR 70 | Resp 16

## 2014-09-28 DIAGNOSIS — M2012 Hallux valgus (acquired), left foot: Secondary | ICD-10-CM | POA: Diagnosis not present

## 2014-09-28 DIAGNOSIS — M779 Enthesopathy, unspecified: Secondary | ICD-10-CM

## 2014-09-28 DIAGNOSIS — M2020 Hallux rigidus, unspecified foot: Secondary | ICD-10-CM

## 2014-09-28 NOTE — Progress Notes (Signed)
Subjective:     Patient ID: Sonya Luna, female   DOB: 12-Jun-1951, 64 y.o.   MRN: [id number]  HPI patient states I'm doing very well overall but my feet do get tired with prolonged walking   Review of Systems     Objective:   Physical Exam Neurovascular status intact with muscle strength adequate range of motion within normal limits and noted to have good range of motion first MPJ left with no crepitus of the joint and incision site that's healing well with moderate edema still present    Assessment:     Doing well with hallux limitus rigidus correction left with good motion at this time and also moderate flatfoot deformity with chronic tendinitis    Plan:     Reviewed x-rays and continue to increase activity and scanned for customized orthotics at this current time to reduce stress against the feet

## 2014-10-21 ENCOUNTER — Ambulatory Visit: Payer: 59 | Admitting: *Deleted

## 2014-10-21 DIAGNOSIS — M779 Enthesopathy, unspecified: Secondary | ICD-10-CM

## 2014-10-21 NOTE — Patient Instructions (Signed)

## 2014-10-21 NOTE — Progress Notes (Signed)
Patient ID: Sonya Luna, female   DOB: [birth year], 64 y.o.   MRN: [id number] Picking up inserts

## 2014-12-02 ENCOUNTER — Ambulatory Visit (INDEPENDENT_AMBULATORY_CARE_PROVIDER_SITE_OTHER): Payer: 59

## 2014-12-02 ENCOUNTER — Encounter: Payer: Self-pay | Admitting: Podiatry

## 2014-12-02 ENCOUNTER — Ambulatory Visit (INDEPENDENT_AMBULATORY_CARE_PROVIDER_SITE_OTHER): Payer: 59 | Admitting: Podiatry

## 2014-12-02 VITALS — BP 142/81 | HR 66 | Resp 15

## 2014-12-02 DIAGNOSIS — Z9889 Other specified postprocedural states: Secondary | ICD-10-CM

## 2014-12-02 DIAGNOSIS — M2020 Hallux rigidus, unspecified foot: Secondary | ICD-10-CM

## 2014-12-02 NOTE — Progress Notes (Signed)
Subjective:     Patient ID: Sonya Luna, female   DOB: 30-Jun-1951, 65 y.o.   MRN: [id number]  HPI patient states I'm doing pretty well with my left foot and I'm able to walk distances without pain   Review of Systems     Objective:   Physical Exam Neurovascular status intact with good range of motion first MPJ left with no crepitus within the joint and good range of motion noted    Assessment:     Doing well post hallux limitus repair left    Plan:     Resume all normal activity reviewed final x-rays and reappoint as needed

## 2015-04-15 ENCOUNTER — Other Ambulatory Visit: Payer: Self-pay

## 2015-04-15 DIAGNOSIS — Z1231 Encounter for screening mammogram for malignant neoplasm of breast: Secondary | ICD-10-CM

## 2015-05-10 ENCOUNTER — Ambulatory Visit
Admission: RE | Admit: 2015-05-10 | Discharge: 2015-05-10 | Disposition: A | Discharge status: Home / Self Care | Payer: 59 | Source: Ambulatory Visit

## 2015-05-10 DIAGNOSIS — Z1231 Encounter for screening mammogram for malignant neoplasm of breast: Secondary | ICD-10-CM

## 2015-06-25 ENCOUNTER — Encounter: Payer: Self-pay | Admitting: Family Medicine

## 2015-06-25 ENCOUNTER — Ambulatory Visit (INDEPENDENT_AMBULATORY_CARE_PROVIDER_SITE_OTHER): Payer: 59 | Admitting: Family Medicine

## 2015-06-25 VITALS — BP 100/70 | Ht 66.0 in | Wt 128.0 lb

## 2015-06-25 DIAGNOSIS — S86111A Strain of other muscle(s) and tendon(s) of posterior muscle group at lower leg level, right leg, initial encounter: Secondary | ICD-10-CM

## 2015-06-25 DIAGNOSIS — S86811A Strain of other muscle(s) and tendon(s) at lower leg level, right leg, initial encounter: Secondary | ICD-10-CM | POA: Diagnosis not present

## 2015-06-25 DIAGNOSIS — M25561 Pain in right knee: Secondary | ICD-10-CM

## 2015-06-25 NOTE — Patient Instructions (Signed)
your knee is very unhappy.  You have a strain of your medial gastrocnemius muscle. Ice it once daily for about 15 minutes.  Use the larger heel lift in her shoe for the next 2 weeks when you're doing a lot of walking.this will take some strain off the gastrocnemius muscle.  After 2 weeks which out to be smaller heel pad and use that until I see you back.  I'd like to see back in about a month.   Due to the unhappiness a few knee has some fluid in the knee joint. This is making it stiff and feel like you have a sharp shooting pain in the anterior portion.Let's see if the compression sleeve helps this. Avoid full flexion.   Call with any new or worsening symptoms. Have a great holiday

## 2015-06-28 DIAGNOSIS — S86119A Strain of other muscle(s) and tendon(s) of posterior muscle group at lower leg level, unspecified leg, initial encounter: Secondary | ICD-10-CM | POA: Insufficient documentation

## 2015-06-28 NOTE — Progress Notes (Signed)
   Subjective:    Patient ID: Sonya Luna, female    DOB: 02-13-1951, 64 y.o.   MRN: [id number]  HPI  2-3 weeks right knee stiffness, posterior right knee pain with walking. Starts pretty soon after initiation of activity and does not resolve until sognificant rest. Walks for fitness. Also does yoga. Mayhave started after an extra long walk she did a few weeks ago.  Review of Systems Pertinent review of systems: negative for fever or unusual weight change. No swelling or erythema of right knee.    Objective:   Physical Exam ViKNEE: right: FROM. Ligamentouslyintact to varus and valgus. Normal lachman. Popliteal space is benign. Mild ttp proximal lateral gastroc muscle. [place] small amount of fluid at origin lateral gastroc. No specific tear of muscle seen. There is fluid in suprapatellar puch and some debris noted floating in fluid. Rest kf knee [place] is normal with intact quad and patellar tendons,Menisci look normal.        Assessment & Plan:

## 2015-06-28 NOTE — Assessment & Plan Note (Addendum)
Compression, ice. Heel lift for 2 weeks w 5/16 inch lift, then 2 weeks 3/16 inch lift. Activity modification (see pt instructions) rtc 4 weeks Some fluid seen in suprapatellar pouch but clinically little to no effusion so I do not think aspiration indicated.

## 2015-07-30 ENCOUNTER — Ambulatory Visit: Payer: 59 | Admitting: Family Medicine

## 2015-08-11 ENCOUNTER — Encounter: Payer: Self-pay | Admitting: Sports Medicine

## 2015-08-11 ENCOUNTER — Ambulatory Visit (INDEPENDENT_AMBULATORY_CARE_PROVIDER_SITE_OTHER): Payer: 59 | Admitting: Sports Medicine

## 2015-08-11 VITALS — BP 128/58 | Ht 66.0 in | Wt 128.0 lb

## 2015-08-11 DIAGNOSIS — M23206 Derangement of unspecified meniscus due to old tear or injury, right knee: Secondary | ICD-10-CM | POA: Diagnosis not present

## 2015-08-11 DIAGNOSIS — M23306 Other meniscus derangements, unspecified meniscus, right knee: Secondary | ICD-10-CM | POA: Insufficient documentation

## 2015-08-11 NOTE — Progress Notes (Signed)
Patient ID: Sonya Luna, female   DOB: 1950-08-23, 65 y.o.   MRN: [id number]  Chief complaint-- followup of right knee pain  History of present illness:  Patient was seen one month ago by Dr. Nori Riis.  She had a small effusion.  She had pain over the lateral gastroc just behind the knee. She was given some calf stretches and the calf tightness resolved in a few days.  Pain level now is generally 0/10 The knee doesn't feel like his swelling. No giving way. No locking. She thinks her initial injury may have been in doing Childs pose and other knee flexion doing yoga. She will recreate some pain with deep knee bend of child's pose  ROS No generalized joint swelling No morning stiffness No numbness in L exts  Physical examination  NAD Alert and oriented x3 BP 128/58 mmHg  Ht 5\' 6"  (1.676 m)  Wt 128 lb (58.06 kg)  BMI 20.67 kg/m2  LMP 07/03/2002  Right knee Gen. Appearance is normal However on extension she is - 5 versus -2 on the left Flexion on right is 125 versus flexion of 150 left She gets pain with flexion past 125  Ligamentous testing is normal Quadriceps and lower extremity strength is normal Joint line laterally is nonpainful Joint line medially has a localized area of tenderness to palpation  Ultrasound of right knee There is a persistent mild to moderate effusion of the right knee not seen on the left There is a degenerative medial meniscus with some hypoechoic swelling that extends along the joint line This is only posterior half of the meniscus On the lateral meniscus there is a degenerated area just proximal to the lateral gastrocnemius tendon This shows calcification and hypoechoic change Quad tendon and patellar tendon are normal

## 2015-08-11 NOTE — Assessment & Plan Note (Signed)
In retrospect I believe the meniscal injury probably triggered some swelling and irritation of the gastrocnemius muscle as that resolved very quickly  She needs to continue compression She was given a series of home exercises for extension and flexion of the knee If we are unable to regain normal range of motion we'll send her to physical therapy  Limit all yoga and other activities that cause the knee flexion

## 2015-09-22 ENCOUNTER — Ambulatory Visit (INDEPENDENT_AMBULATORY_CARE_PROVIDER_SITE_OTHER): Payer: 59 | Admitting: Sports Medicine

## 2015-09-22 ENCOUNTER — Encounter: Payer: Self-pay | Admitting: Sports Medicine

## 2015-09-22 VITALS — BP 115/69 | HR 72 | Ht 66.0 in | Wt 128.0 lb

## 2015-09-22 DIAGNOSIS — M23206 Derangement of unspecified meniscus due to old tear or injury, right knee: Secondary | ICD-10-CM

## 2015-09-22 DIAGNOSIS — M25561 Pain in right knee: Secondary | ICD-10-CM | POA: Diagnosis not present

## 2015-09-22 DIAGNOSIS — M23306 Other meniscus derangements, unspecified meniscus, right knee: Secondary | ICD-10-CM

## 2015-09-22 NOTE — Progress Notes (Signed)
Sonya Luna - 65 y.o. female MRN [id number]  Date of birth: [birth year]  CC: Follow-up for right knee pain   SUBJECTIVE:   HPI Sonya Luna is a very pleasant 65 year old female here for follow-up of right knee pain for the last 4 months. At her last visit on 08/11/2015 she was found to have a low-grade effusion of her right knee. She believes she injured her right knee doing child's pose and yoga. Since her last visit she has been working on knee range of motion flexibility and now can fully flex her knee equivalent to her left side. She denies any overt swelling of her knee. She has no pain walking anymore. She has returned to yoga and is doing relatively well other than posterior knee and proximal right calf pain with deep knee bending such as child's pose. She does not require any medications. She has stopped using her knee sleeve.  ROS:     14 point review of systems negative other than that listed above in history of present illness regards to musculoskeletal issue. Specifically denies fevers chills or night sweats. Denies any joint swelling. Denies any new rashes.  HISTORY: Past Medical, Surgical, Social, and Family History Reviewed & Updated per EMR.  Pertinent Historical Findings include: No medical issues but history of multiple musculoskeletal injuries.  OBJECTIVE: BP 115/69 mmHg  Pulse 72  Ht 5\' 6"  (1.676 m)  Wt 128 lb (58.06 kg)  BMI 20.67 kg/m2  LMP 07/03/2002  Physical Exam  Calm, no acute distress Nonlabored breathing  Knee: right Very mild anterior medial joint line tenderness. No mass file in the popliteal fossa ROM normal in flexion and extension and lower leg rotation. Ligaments with solid consistent endpoints including ACL, PCL, LCL, MCL. Negative Mcmurray's and [place]ss[place] Non painful patellar compression. Patellar and quadriceps tendons unremarkable. Hamstring and quadriceps strength is normal. 5 out of 5 strength with leg lift and leg abduction. One legged  squat performed without difficulty.  Ultrasound: Long and short axis views of the right knee were obtained. There is a low-grade effusion in the suprapatellar patellar pouch. Additionally there are hypo-echoic irregularities within the medial meniscus anteriorly although does not appear full-thickness. Additionally there is a calcification of the midportion of the medial meniscus. There is good meniscal bulk. The lateral meniscus is relatively unremarkable and consistent with long-term degeneration. Patellar tendon is a normal fibrillar pattern without any obvious abnormalities. There is a potential early Baker's cyst along the medial aspect of the popliteal fossa but otherwise no abnormality seen within the popliteal fossa. Overall this exam is consistent with a meniscal tear most likely chronic with intra-articular intermittent irritation resulting in a low-grade effusion.   MEDICATIONS, LABS & OTHER ORDERS: Previous Medications   VITAMIN B-12 (CYANOCOBALAMIN) 1000 MCG TABLET    Take 1,000 mcg by mouth daily.   VITAMIN D, CHOLECALCIFEROL, PO    Take 2,000 Int'l Units by mouth.   Modified Medications   No medications on file   New Prescriptions   No medications on file   Discontinued Medications   CALCIUM CARBONATE 200 MG CAPSULE    Take 250 mg by mouth 2 (two) times daily with a meal.  No orders of the defined types were placed in this encounter.   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 She does have a low-grade effusion consistent with an intra-articular process. She is relatively asymptomatic  at this time. She has full range of motion now. We also suggested modifications to limit deep knee bending and twisting. She should continue to work on knee stabilizer strength. We will see her back on as-needed basis. Call with any questions.

## 2015-09-27 ENCOUNTER — Ambulatory Visit (INDEPENDENT_AMBULATORY_CARE_PROVIDER_SITE_OTHER): Payer: 59 | Admitting: Obstetrics & Gynecology

## 2015-09-27 ENCOUNTER — Encounter: Payer: Self-pay | Admitting: Obstetrics & Gynecology

## 2015-09-27 VITALS — BP 120/88 | HR 64 | Resp 16 | Ht 65.25 in | Wt 129.0 lb

## 2015-09-27 DIAGNOSIS — Z23 Encounter for immunization: Secondary | ICD-10-CM | POA: Diagnosis not present

## 2015-09-27 DIAGNOSIS — Z01419 Encounter for gynecological examination (general) (routine) without abnormal findings: Secondary | ICD-10-CM | POA: Diagnosis not present

## 2015-09-27 DIAGNOSIS — L03011 Cellulitis of right finger: Secondary | ICD-10-CM

## 2015-09-27 MED ORDER — SULFAMETHOXAZOLE-TRIMETHOPRIM 800-160 MG PO TABS: 1.0000 | ORAL_TABLET | Freq: Two times a day (BID) | ORAL | Status: DC

## 2015-09-27 NOTE — Progress Notes (Addendum)
65 y.o. Sonya Luna MarriedCaucasianF here for annual exam.  Doing well except for joint issues.  Reports swelling of middle right finger after having a splinter in her hand over the weekend.    No vaginal bleeding.    Patient's last menstrual period was 07/03/2002.          Sexually active: Yes.    The current method of family planning is post menopausal status.    Exercising: Yes.    Training, walking, yoga Smoker:  no  Health Maintenance: Pap:  08/21/14 Neg. 08/16/11 Neg. HR HPV:neg History of abnormal Pap:  no MMG:  05/12/15 BIRADS1:neg Colonoscopy:  12/03/13 Normal - repat 10 years.  Dr Oletta Lamas. BMD:   03/2007.  Had definitely done a BMD with Dr. Joylene Draft.  I do not have a copy of this.   TDaP:  UTD w/ PCP Screening Labs: PCP, Urine today: PCP   reports that she has never smoked. She has never used smokeless tobacco. She reports that she drinks about 1.2 oz of alcohol per week. She reports that she does not use illicit drugs.  Past Medical History  Diagnosis Date  . Mallet finger     middle finger-right hand  . Bone spur of foot     right foot    Past Surgical History  Procedure Laterality Date  . Foot surgery      outpatient-for bone spur right foot  . Foot surgery      for bone spur-left foot    Current Outpatient Prescriptions  Medication Sig Dispense Refill  . vitamin B-12 (CYANOCOBALAMIN) 1000 MCG tablet Take 1,000 mcg by mouth daily.    Marland Kitchen VITAMIN D, CHOLECALCIFEROL, PO Take 2,000 Int'l Units by mouth.     No current facility-administered medications for this visit.    Family History  Problem Relation Age of Onset  . Diabetes Maternal Grandfather   . Cervical cancer Paternal Grandmother   . Heart attack Father     smoker-had heart surgery    ROS:  Pertinent items are noted in HPI.  Otherwise, a comprehensive ROS was negative.  Exam:   BP 120/88 mmHg  Pulse 64  Resp 16  Ht 5' 5.25" (1.657 m)  Wt 129 lb (58.514 kg)  BMI 21.31 kg/m2  LMP 07/03/2002  Weight  change: +3#  Height: 5' 5.25" (165.7 cm)  Ht Readings from Last 3 Encounters:  09/27/15 5' 5.25" (1.657 m)  09/22/15 5\' 6"  (1.676 m)  08/11/15 5\' 6"  (1.676 m)    General appearance: alert, cooperative and appears stated age Head: Normocephalic, without obvious abnormality, atraumatic Neck: no adenopathy, supple, symmetrical, trachea midline and thyroid normal to inspection and palpation Lungs: clear to auscultation bilaterally Breasts: normal appearance, no masses or tenderness Heart: regular rate and rhythm Abdomen: soft, non-tender; bowel sounds normal; no masses,  no organomegaly Extremities: Right middle finger, erythematous down below first joint, tender to palpation Skin: Skin color, texture, turgor normal. No rashes or lesions Lymph nodes: Cervical, supraclavicular, and axillary nodes normal. No abnormal inguinal nodes palpated Neurologic: Grossly normal   Pelvic: External genitalia:  no lesions              Urethra:  normal appearing urethra with no masses, tenderness or lesions              Bartholins and Skenes: normal                 Vagina: normal appearing vagina with normal color and discharge, no lesions  Cervix: no lesions              Pap taken: No. Bimanual Exam:  Uterus:  normal size, contour, position, consistency, mobility, non-tender              Adnexa: normal adnexa and no mass, fullness, tenderness               Rectovaginal: Confirms               Anus:  normal sphincter tone, no lesions  Chaperone was present for exam.  A:  Well Woman with normal exam PMP, No HRT Mild SUI Right middle finger cellulits  P: Mammogram yearly Pap neg 2016. Neg HR HPV 2013.  No pap today Tdap today. Bactrim DS bid x 7 days.  Pt will call ortho office for appt for follow up Lab work with Dr. Joylene Draft Will get copy of BMD from Dr. Joylene Draft. Return annually or prn  10/07/15.  BMD from Dr. Joylene Draft.  T score -1.5 and -1.0 in hips  -1.4 in spine.  Scanned  into EPIC.

## 2015-09-28 DIAGNOSIS — S60452A Superficial foreign body of right middle finger, initial encounter: Secondary | ICD-10-CM | POA: Diagnosis not present

## 2015-09-28 DIAGNOSIS — S67192A Crushing injury of right middle finger, initial encounter: Secondary | ICD-10-CM | POA: Diagnosis not present

## 2015-10-07 NOTE — Addendum Note (Signed)
Addended by: Megan Salon on: 10/07/2015 07:17 AM   Modules accepted: Miquel Dunn

## 2015-11-05 ENCOUNTER — Ambulatory Visit: Payer: 59 | Admitting: Obstetrics & Gynecology

## 2016-02-21 DIAGNOSIS — H524 Presbyopia: Secondary | ICD-10-CM | POA: Diagnosis not present

## 2016-03-16 DIAGNOSIS — R7301 Impaired fasting glucose: Secondary | ICD-10-CM | POA: Diagnosis not present

## 2016-03-16 DIAGNOSIS — M859 Disorder of bone density and structure, unspecified: Secondary | ICD-10-CM | POA: Diagnosis not present

## 2016-03-16 DIAGNOSIS — Z0000 Encounter for general adult medical examination without abnormal findings: Secondary | ICD-10-CM | POA: Diagnosis not present

## 2016-03-23 DIAGNOSIS — M859 Disorder of bone density and structure, unspecified: Secondary | ICD-10-CM | POA: Diagnosis not present

## 2016-03-23 DIAGNOSIS — Z780 Asymptomatic menopausal state: Secondary | ICD-10-CM | POA: Diagnosis not present

## 2016-03-23 DIAGNOSIS — N390 Urinary tract infection, site not specified: Secondary | ICD-10-CM | POA: Diagnosis not present

## 2016-03-23 DIAGNOSIS — R030 Elevated blood-pressure reading, without diagnosis of hypertension: Secondary | ICD-10-CM | POA: Diagnosis not present

## 2016-03-23 DIAGNOSIS — D72819 Decreased white blood cell count, unspecified: Secondary | ICD-10-CM | POA: Diagnosis not present

## 2016-03-23 DIAGNOSIS — M7660 Achilles tendinitis, unspecified leg: Secondary | ICD-10-CM | POA: Diagnosis not present

## 2016-03-23 DIAGNOSIS — D7589 Other specified diseases of blood and blood-forming organs: Secondary | ICD-10-CM | POA: Diagnosis not present

## 2016-03-23 DIAGNOSIS — R7301 Impaired fasting glucose: Secondary | ICD-10-CM | POA: Diagnosis not present

## 2016-03-23 DIAGNOSIS — Z0000 Encounter for general adult medical examination without abnormal findings: Secondary | ICD-10-CM | POA: Diagnosis not present

## 2016-03-27 DIAGNOSIS — Z1212 Encounter for screening for malignant neoplasm of rectum: Secondary | ICD-10-CM | POA: Diagnosis not present

## 2016-04-10 ENCOUNTER — Other Ambulatory Visit: Payer: Self-pay | Admitting: Obstetrics & Gynecology

## 2016-04-10 DIAGNOSIS — Z1231 Encounter for screening mammogram for malignant neoplasm of breast: Secondary | ICD-10-CM

## 2016-05-11 ENCOUNTER — Ambulatory Visit
Admission: RE | Admit: 2016-05-11 | Discharge: 2016-05-11 | Disposition: A | Discharge status: Home / Self Care | Payer: 59 | Source: Ambulatory Visit | Attending: Obstetrics & Gynecology | Admitting: Obstetrics & Gynecology

## 2016-05-11 DIAGNOSIS — Z1231 Encounter for screening mammogram for malignant neoplasm of breast: Secondary | ICD-10-CM | POA: Diagnosis not present

## 2016-06-19 DIAGNOSIS — D2261 Melanocytic nevi of right upper limb, including shoulder: Secondary | ICD-10-CM | POA: Diagnosis not present

## 2016-06-19 DIAGNOSIS — L218 Other seborrheic dermatitis: Secondary | ICD-10-CM | POA: Diagnosis not present

## 2016-06-19 DIAGNOSIS — D225 Melanocytic nevi of trunk: Secondary | ICD-10-CM | POA: Diagnosis not present

## 2016-06-19 DIAGNOSIS — L603 Nail dystrophy: Secondary | ICD-10-CM | POA: Diagnosis not present

## 2016-06-19 DIAGNOSIS — D2271 Melanocytic nevi of right lower limb, including hip: Secondary | ICD-10-CM | POA: Diagnosis not present

## 2016-06-19 DIAGNOSIS — D2262 Melanocytic nevi of left upper limb, including shoulder: Secondary | ICD-10-CM | POA: Diagnosis not present

## 2016-06-19 DIAGNOSIS — L821 Other seborrheic keratosis: Secondary | ICD-10-CM | POA: Diagnosis not present

## 2016-07-21 ENCOUNTER — Encounter: Payer: 59 | Admitting: Internal Medicine

## 2016-08-04 ENCOUNTER — Ambulatory Visit (INDEPENDENT_AMBULATORY_CARE_PROVIDER_SITE_OTHER): Payer: 59 | Admitting: Internal Medicine

## 2016-08-04 DIAGNOSIS — Z7189 Other specified counseling: Secondary | ICD-10-CM

## 2016-08-04 DIAGNOSIS — Z23 Encounter for immunization: Secondary | ICD-10-CM

## 2016-08-04 DIAGNOSIS — Z9189 Other specified personal risk factors, not elsewhere classified: Secondary | ICD-10-CM

## 2016-08-04 DIAGNOSIS — Z789 Other specified health status: Secondary | ICD-10-CM

## 2016-08-04 DIAGNOSIS — Z7184 Encounter for health counseling related to travel: Secondary | ICD-10-CM

## 2016-08-04 MED ORDER — TYPHOID VACCINE PO CPDR: 1.0000 | DELAYED_RELEASE_CAPSULE | ORAL | 0 refills | Status: DC

## 2016-08-04 NOTE — Progress Notes (Signed)
Subjective:   Sonya Luna is a 66 y.o. female who presents to the Infectious Disease clinic for travel consultation. Planned departure date: march 1 Planned return date: march 16 Countries of travel: [place], [place], [place] Areas in country: beachside, urban, rural Accommodations: resorts Purpose of travel: vacation Prior travel out of [place]: yes  uptodate on tdap   Objective:       Assessment:   No contraindications to travel. none     Plan:   Immunization = gave hep A #1, plus vivotif  Traveler's diarrhea = gave azithromycin to use if needed  Mosquito bite prevention = gave recs to use permethrin plus DEET   recs to also have medical evacuation insurance if needed

## 2016-08-04 NOTE — Patient Instructions (Signed)
[facility]                [address], [place]                   [place], [place] [address]                      Phone: [phone]                        Fax: [phone]   Planned departure date: march 1st         Planned return date: march 16th Countries of travel: [place], [place], [place]  Guidelines for the Prevention & Treatment of Traveler's Diarrhea  Prevention: "Boil it, Peel it, Lacinda Axon it, or Forget it"   the fewer chances -> lower risk: try to stick to food & water precautions as much as possible"   If it's "piping hot"; it is probably okay, if not, it may not be   Treatment   1) You should always take care to drink lots of fluids in order to avoid dehydration   2) You should bring medications with you in case you come down with a case of diarrhea   3) OTC = bring pepto-bismol - can take with initial abdominal symptoms;                    Imodium - can help slow down your intestinal tract, can help relief cramps                    and diarrhea, can take if no bloody diarrhea  Use azithromycin if needed for traveler's diarrhea  Guidelines for the Prevention of Malaria  Avoidance:  -fewer mosquito bites = lower risk. Mosquitos can bite at night as well as daytime  -cover up (long sleeve clothing), mosquito nets, screens  -Insect repellent for your skin: 68M ultrathon ( DEET containing lotion > 20%):Sawyer's permethrin for clothes ( permethrin spray)     Immunizations received today: oral typhoid and hep A #1 Future immunizations: come back in August 2018 for 2nd dose of hep A for life long immunity   Prior to travel:  1) Be sure to pick up appropriate prescriptions, including medicine you take daily. Do not expect to be able to fill your prescriptions abroad.  2) Strongly consider obtaining traveler's insurance, including emergency evacuation insurance. Most plans in the [place] do not cover participants abroad.  (see below for resources)  3) Register at the appropriate [place] embassy or consulate with travel dates so they are aware of your presence in-country and for helpful advice during travel using the [employer] (STEP, GreenNylon.com.cy).  4) Leave contact information with a relative or friend.  5) Keep a Research officer, political party, credit cards in case they become lost or stolen  6) Inform your credit card company that you will be travelling abroad   During travel:  1) If you become ill and need medical advice, the [place] [employer]	[place] of the country you are traveling in general provides a list of Jar[place] speaking doctors.  We are also available on MyChart for remote consultation if you register prior to travel. 2) Avoid motorcycles or scooters when at all possible. Traffic laws in many countries are lax and accidents occur frequently.  3) Do not take any unnecessary risks that you wouldn't do at  home.   Resources:  -Country specific information: BlindResource.ca or GreenNylon.com.cy  -Press photographer (DEET, mosquito nets): [employer], [employer] store, [employer], [place] insurance options: gatewayplans.com; http://clayton-rivera.info/; travelguard.com or Good [employer], gninsurance.com or info@gninsurance .com, [phone].   Post Travel:  If you return from your trip ill, call your primary care doctor or our travel clinic @ [phone].   Enjoy your trip and know that with proper pre-travel preparation, most people have an enjoyable and uninterrupted trip!

## 2016-08-07 MED FILL — VIVOTIF EC CAPSULE: 8 days supply | Qty: 4 | Fill #0

## 2016-10-13 NOTE — Progress Notes (Signed)
66 y.o. G61P4 MarriedCaucasianF here for annual exam.  Just got back from big trip to [place], [place], [place].  Was a 16 day trip.    Denies vaginal bleeding.    Patient's last menstrual period was 07/03/2002.          Sexually active: Yes.    The current method of family planning is post menopausal status.    Exercising: Yes.    Yoga and Muscle/ cardio classes Smoker:  no  Health Maintenance: Pap:  08/21/14 Neg   08/16/11 Neg. HR HPV:neg  History of abnormal Pap:  no MMG:  05/11/16 BIRADS1:neg  Colonoscopy:  12/03/13 Normal. f/u 10 years  BMD:   05/10/15 Osteopenia  TDaP:  09/2015 Pneumonia vaccine(s):  Done with PCP Zostavax:   Done with PCP Hep C testing: Unsure Screening Labs: PCP   reports that she has never smoked. She has never used smokeless tobacco. She reports that she drinks about 1.2 oz of alcohol per week . She reports that she does not use drugs.  Past Medical History:  Diagnosis Date  . Bone spur of foot    right foot  . Mallet finger    middle finger-right hand    Past Surgical History:  Procedure Laterality Date  . FOOT SURGERY     outpatient-for bone spur right foot  . FOOT SURGERY     for bone spur-left foot    Current Outpatient Prescriptions  Medication Sig Dispense Refill  . vitamin B-12 (CYANOCOBALAMIN) 1000 MCG tablet Take 1,000 mcg by mouth daily.    Marland Kitchen VITAMIN D, CHOLECALCIFEROL, PO Take 2,000 Int'l Units by mouth.    . zolpidem (AMBIEN) 5 MG tablet Take 1 tablet by mouth as needed.     No current facility-administered medications for this visit.     Family History  Problem Relation Age of Onset  . Diabetes Maternal Grandfather   . Cervical cancer Paternal Grandmother   . Heart attack Father     smoker-had heart surgery    ROS:  Pertinent items are noted in HPI.  Otherwise, a comprehensive ROS was negative.  Exam:   BP 108/80 (BP Location: Right Arm, Patient Position: Sitting, Cuff Size: Normal)   Pulse 66   Resp 14   Ht 5' 5.5"  (1.664 m)   Wt 126 lb (57.2 kg)   LMP 07/03/2002   BMI 20.65 kg/m   Weight change: -2#  Height: 5' 5.5" (166.4 cm)  Ht Readings from Last 3 Encounters:  10/16/16 5' 5.5" (1.664 m)  09/27/15 5' 5.25" (1.657 m)  09/22/15 5\' 6"  (1.676 m)    General appearance: alert, cooperative and appears stated age Head: Normocephalic, without obvious abnormality, atraumatic Neck: no adenopathy, supple, symmetrical, trachea midline and thyroid normal to inspection and palpation Lungs: clear to auscultation bilaterally Breasts: normal appearance, no masses or tenderness Heart: regular rate and rhythm Abdomen: soft, non-tender; bowel sounds normal; no masses,  no organomegaly Extremities: extremities normal, atraumatic, no cyanosis or edema Skin: Skin color, texture, turgor normal. No rashes or lesions Lymph nodes: Cervical, supraclavicular, and axillary nodes normal. No abnormal inguinal nodes palpated Neurologic: Grossly normal   Pelvic: External genitalia:  no lesions              Urethra:  normal appearing urethra with no masses, tenderness or lesions              Bartholins and Skenes: normal  Vagina: normal appearing vagina with normal color and discharge, no lesions              Cervix: no lesions              Pap taken: Yes.   Bimanual Exam:  Uterus:  normal size, contour, position, consistency, mobility, non-tender              Adnexa: normal adnexa and no mass, fullness, tenderness               Rectovaginal: Confirms               Anus:  normal sphincter tone, no lesions  Chaperone was present for exam.  A:  Well Woman with normal exam PMP, no HRT  P:   Mammogram guidelines reviewed pap smear and HR HPV obtained today Hep C antibody will be obtained today Discussed with pt new shingles vaccine.  She will update this with Dr. Joylene Draft Return annually or prn

## 2016-10-16 ENCOUNTER — Other Ambulatory Visit (HOSPITAL_COMMUNITY)
Admission: RE | Admit: 2016-10-16 | Discharge: 2016-10-16 | Disposition: A | Discharge status: Home / Self Care | Payer: 59 | Source: Ambulatory Visit | Attending: Obstetrics & Gynecology | Admitting: Obstetrics & Gynecology

## 2016-10-16 ENCOUNTER — Ambulatory Visit (INDEPENDENT_AMBULATORY_CARE_PROVIDER_SITE_OTHER): Payer: 59 | Admitting: Obstetrics & Gynecology

## 2016-10-16 ENCOUNTER — Encounter: Payer: Self-pay | Admitting: Obstetrics & Gynecology

## 2016-10-16 VITALS — BP 108/80 | HR 66 | Resp 14 | Ht 65.5 in | Wt 126.0 lb

## 2016-10-16 DIAGNOSIS — Z01419 Encounter for gynecological examination (general) (routine) without abnormal findings: Secondary | ICD-10-CM | POA: Diagnosis not present

## 2016-10-16 DIAGNOSIS — Z124 Encounter for screening for malignant neoplasm of cervix: Secondary | ICD-10-CM | POA: Insufficient documentation

## 2016-10-16 DIAGNOSIS — Z205 Contact with and (suspected) exposure to viral hepatitis: Secondary | ICD-10-CM

## 2016-10-17 LAB — HEPATITIS C ANTIBODY: HCV AB: NEGATIVE

## 2016-10-18 LAB — CYTOLOGY - PAP
Diagnosis: NEGATIVE
HPV: NOT DETECTED

## 2017-01-05 ENCOUNTER — Ambulatory Visit: Payer: 59 | Admitting: Obstetrics & Gynecology

## 2017-02-07 ENCOUNTER — Ambulatory Visit (INDEPENDENT_AMBULATORY_CARE_PROVIDER_SITE_OTHER): Payer: 59 | Admitting: *Deleted

## 2017-02-07 DIAGNOSIS — Z23 Encounter for immunization: Secondary | ICD-10-CM

## 2017-02-07 DIAGNOSIS — H1132 Conjunctival hemorrhage, left eye: Secondary | ICD-10-CM | POA: Diagnosis not present

## 2017-02-07 DIAGNOSIS — H109 Unspecified conjunctivitis: Secondary | ICD-10-CM | POA: Diagnosis not present

## 2017-02-20 DIAGNOSIS — H524 Presbyopia: Secondary | ICD-10-CM | POA: Diagnosis not present

## 2017-02-27 DIAGNOSIS — H524 Presbyopia: Secondary | ICD-10-CM | POA: Diagnosis not present

## 2017-04-11 ENCOUNTER — Other Ambulatory Visit: Payer: Self-pay | Admitting: Obstetrics & Gynecology

## 2017-04-11 DIAGNOSIS — Z1231 Encounter for screening mammogram for malignant neoplasm of breast: Secondary | ICD-10-CM

## 2017-05-28 ENCOUNTER — Ambulatory Visit
Admission: RE | Admit: 2017-05-28 | Discharge: 2017-05-28 | Disposition: A | Discharge status: Home / Self Care | Payer: PPO | Source: Ambulatory Visit | Attending: Obstetrics & Gynecology | Admitting: Obstetrics & Gynecology

## 2017-05-28 DIAGNOSIS — Z1231 Encounter for screening mammogram for malignant neoplasm of breast: Secondary | ICD-10-CM

## 2017-09-10 DIAGNOSIS — M859 Disorder of bone density and structure, unspecified: Secondary | ICD-10-CM | POA: Diagnosis not present

## 2017-09-10 DIAGNOSIS — Z79899 Other long term (current) drug therapy: Secondary | ICD-10-CM | POA: Diagnosis not present

## 2017-09-10 DIAGNOSIS — R7301 Impaired fasting glucose: Secondary | ICD-10-CM | POA: Diagnosis not present

## 2017-09-13 DIAGNOSIS — M859 Disorder of bone density and structure, unspecified: Secondary | ICD-10-CM | POA: Diagnosis not present

## 2017-09-13 DIAGNOSIS — M2010 Hallux valgus (acquired), unspecified foot: Secondary | ICD-10-CM | POA: Diagnosis not present

## 2017-09-13 DIAGNOSIS — D7589 Other specified diseases of blood and blood-forming organs: Secondary | ICD-10-CM | POA: Diagnosis not present

## 2017-09-13 DIAGNOSIS — M25511 Pain in right shoulder: Secondary | ICD-10-CM | POA: Diagnosis not present

## 2017-09-13 DIAGNOSIS — N390 Urinary tract infection, site not specified: Secondary | ICD-10-CM | POA: Diagnosis not present

## 2017-09-13 DIAGNOSIS — Z6820 Body mass index (BMI) 20.0-20.9, adult: Secondary | ICD-10-CM | POA: Diagnosis not present

## 2017-09-13 DIAGNOSIS — Z0000 Encounter for general adult medical examination without abnormal findings: Secondary | ICD-10-CM | POA: Diagnosis not present

## 2017-09-13 DIAGNOSIS — Z1389 Encounter for screening for other disorder: Secondary | ICD-10-CM | POA: Diagnosis not present

## 2017-09-13 DIAGNOSIS — D72819 Decreased white blood cell count, unspecified: Secondary | ICD-10-CM | POA: Diagnosis not present

## 2017-09-13 DIAGNOSIS — R7301 Impaired fasting glucose: Secondary | ICD-10-CM | POA: Diagnosis not present

## 2017-09-13 DIAGNOSIS — R030 Elevated blood-pressure reading, without diagnosis of hypertension: Secondary | ICD-10-CM | POA: Diagnosis not present

## 2017-09-13 DIAGNOSIS — M7660 Achilles tendinitis, unspecified leg: Secondary | ICD-10-CM | POA: Diagnosis not present

## 2017-09-20 DIAGNOSIS — Z1212 Encounter for screening for malignant neoplasm of rectum: Secondary | ICD-10-CM | POA: Diagnosis not present

## 2017-10-16 DIAGNOSIS — L218 Other seborrheic dermatitis: Secondary | ICD-10-CM | POA: Diagnosis not present

## 2017-10-16 DIAGNOSIS — D2271 Melanocytic nevi of right lower limb, including hip: Secondary | ICD-10-CM | POA: Diagnosis not present

## 2017-10-16 DIAGNOSIS — L821 Other seborrheic keratosis: Secondary | ICD-10-CM | POA: Diagnosis not present

## 2017-10-16 DIAGNOSIS — D225 Melanocytic nevi of trunk: Secondary | ICD-10-CM | POA: Diagnosis not present

## 2017-10-16 DIAGNOSIS — D692 Other nonthrombocytopenic purpura: Secondary | ICD-10-CM | POA: Diagnosis not present

## 2017-10-16 DIAGNOSIS — D2261 Melanocytic nevi of right upper limb, including shoulder: Secondary | ICD-10-CM | POA: Diagnosis not present

## 2017-10-16 DIAGNOSIS — L570 Actinic keratosis: Secondary | ICD-10-CM | POA: Diagnosis not present

## 2017-12-04 ENCOUNTER — Encounter

## 2017-12-04 ENCOUNTER — Ambulatory Visit (INDEPENDENT_AMBULATORY_CARE_PROVIDER_SITE_OTHER): Payer: PPO | Admitting: Obstetrics & Gynecology

## 2017-12-04 ENCOUNTER — Encounter: Payer: Self-pay | Admitting: Obstetrics & Gynecology

## 2017-12-04 ENCOUNTER — Other Ambulatory Visit: Payer: Self-pay

## 2017-12-04 VITALS — BP 118/80 | HR 60 | Resp 16 | Ht 65.5 in | Wt 127.6 lb

## 2017-12-04 DIAGNOSIS — Z01419 Encounter for gynecological examination (general) (routine) without abnormal findings: Secondary | ICD-10-CM | POA: Diagnosis not present

## 2017-12-04 NOTE — Progress Notes (Signed)
67 y.o. G3P4 MarriedCaucasianF here for annual exam.  Had a big trip last year to [place], [place], and [place].  Enjoyed this trip very much.  Going to [place] this summer.  Doing well.    Denies vaginal bleeding.    Patient's last menstrual period was 07/03/2002.          Sexually active: Yes.    The current method of family planning is post menopausal status.    Exercising: Yes.    muscle strengthening classes, yoga Smoker:  no  Health Maintenance: Pap:  10/16/16 Neg. HR HPV:neg   08/21/14 Neg  History of abnormal Pap:  no MMG:  05/28/17 BIRADS1:Neg  Colonoscopy:  12/03/13 Normal. F/u 10 years  BMD:   05/10/15  TDaP:  2017 Pneumonia vaccine(s):  done Shingrix:   Completed  Hep C testing: 10/16/16 Neg  Screening Labs: PCP   reports that she has never smoked. She has never used smokeless tobacco. She reports that she drinks about 1.2 oz of alcohol per week. She reports that she does not use drugs.  Past Medical History:  Diagnosis Date  . Bone spur of foot    right foot  . Mallet finger    middle finger-right hand    Past Surgical History:  Procedure Laterality Date  . FOOT SURGERY     outpatient-for bone spur right foot  . FOOT SURGERY     for bone spur-left foot    Current Outpatient Medications  Medication Sig Dispense Refill  . Multiple Vitamin (MULTIVITAMIN) tablet Take 1 tablet by mouth daily.    Marland Kitchen zolpidem (AMBIEN) 5 MG tablet Take 1 tablet by mouth as needed.     No current facility-administered medications for this visit.     Family History  Problem Relation Age of Onset  . Diabetes Maternal Grandfather   . Cervical cancer Paternal Grandmother   . Heart attack Father        smoker-had heart surgery    Review of Systems  Musculoskeletal: Positive for myalgias.  All other systems reviewed and are negative.   Exam:   BP 118/80 (BP Location: Right Arm, Patient Position: Sitting, Cuff Size: Normal)   Pulse 60   Resp 16   Ht 5' 5.5" (1.664 m)    Wt 127 lb 9.6 oz (57.9 kg)   LMP 07/03/2002   BMI 20.91 kg/m   Height: 5' 5.5" (166.4 cm)  Ht Readings from Last 3 Encounters:  12/04/17 5' 5.5" (1.664 m)  10/16/16 5' 5.5" (1.664 m)  09/27/15 5' 5.25" (1.657 m)    General appearance: alert, cooperative and appears stated age Head: Normocephalic, without obvious abnormality, atraumatic Neck: no adenopathy, supple, symmetrical, trachea midline and thyroid normal to inspection and palpation Lungs: clear to auscultation bilaterally Breasts: normal appearance, no masses or tenderness Heart: regular rate and rhythm Abdomen: soft, non-tender; bowel sounds normal; no masses,  no organomegaly Extremities: extremities normal, atraumatic, no cyanosis or edema Skin: Skin color, texture, turgor normal. No rashes or lesions Lymph nodes: Cervical, supraclavicular, and axillary nodes normal. No abnormal inguinal nodes palpated Neurologic: Grossly normal   Pelvic: External genitalia:  no lesions              Urethra:  normal appearing urethra with no masses, tenderness or lesions              Bartholins and Skenes: normal                 Vagina: normal appearing  vagina with normal color and discharge, no lesions              Cervix: no lesions              Pap taken: No. Bimanual Exam:  Uterus:  normal size, contour, position, consistency, mobility, non-tender              Adnexa: normal adnexa and no mass, fullness, tenderness               Rectovaginal: Confirms               Anus:  normal sphincter tone, no lesions  Chaperone was present for exam.  A:  Well Woman with normal exam PMP, no HRT  P:   Mammogram guidelines reviewed pap smear and neg HR HPV 2018.  Not obtained today. Lab work and vaccines UTD with Dr. Joylene Draft Colonoscopy screening UTD D/w pt when to repeat BMD Return annually or prn

## 2018-05-22 DIAGNOSIS — H04123 Dry eye syndrome of bilateral lacrimal glands: Secondary | ICD-10-CM | POA: Diagnosis not present

## 2018-07-10 ENCOUNTER — Other Ambulatory Visit: Payer: Self-pay | Admitting: Obstetrics & Gynecology

## 2018-07-10 DIAGNOSIS — Z1231 Encounter for screening mammogram for malignant neoplasm of breast: Secondary | ICD-10-CM

## 2018-07-18 ENCOUNTER — Ambulatory Visit (INDEPENDENT_AMBULATORY_CARE_PROVIDER_SITE_OTHER): Payer: PPO | Admitting: Sports Medicine

## 2018-07-18 ENCOUNTER — Encounter: Payer: Self-pay | Admitting: Sports Medicine

## 2018-07-18 VITALS — BP 110/80 | Ht 65.5 in | Wt 126.0 lb

## 2018-07-18 DIAGNOSIS — M67921 Unspecified disorder of synovium and tendon, right upper arm: Secondary | ICD-10-CM | POA: Diagnosis not present

## 2018-07-18 MED ORDER — NITROGLYCERIN 0.2 MG/HR TD PT24: MEDICATED_PATCH | TRANSDERMAL | 0 refills | Status: DC

## 2018-07-18 NOTE — Progress Notes (Signed)
  Sonya Luna - 68 y.o. female MRN [id number]  Date of birth: [birth year]    SUBJECTIVE:      Chief Complaint:/ HPI:  68 year old female with 3 weeks of anterior right shoulder pain that radiates down into the bicep.  She denies any specific injury but believes it occurred when she was doing some overhead exercises.  In general she tries to limit these exercises.  Her pain is worse with reaching overhead and she also notices it while driving.  With certain motions she reports feeling a clicking in the anterior part of her shoulder.  She does have a history of a partial biceps tear back in 2015.  She has been using ice and Aleve which has been minimally helpful.  She does get some relief with using an Ace wrap for compression.  She denies any associated numbness or tingling in the extremity.  No associate skin changes.  No weakness.   ROS:     See HPI  PERTINENT  PMH / PSH FH / / SH:  Past Medical, Surgical, Social, and Family History Reviewed & Updated in the EMR.     OBJECTIVE: BP 110/80   Ht 5' 5.5" (1.664 m)   Wt 126 lb (57.2 kg)   LMP 07/03/2002   BMI 20.65 kg/m   Physical Exam:  Vital signs are reviewed.  GEN: Alert and oriented, NAD Pulm: Breathing unlabored PSY: normal mood, congruent affect  MSK: Right shoulder: No obvious deformity or asymmetry. No bruising. No swelling Tenderness over the bicipital groove.  No tenderness over the [facility] joint Full ROM in flexion, abduction, internal/external rotation NV intact distally Special Tests:  - Impingement: Pain with Hawkins.  Negative Neers.  - Supraspinatus: Negative empty can.  5/5 strength - Infraspinatus/Teres: 5/5 strength with ER - Subscapularis: negative belly press, 5/5 strength with IR - Biceps tendon: Positive speeds.  Negative Yergason's - AC Joint: Negative cross arm  MSK [place]: Limited ultrasound of the right shoulder  BT short:  There appears to be a small split tear within the biceps tendon at the bicipital  groove.  Distally there is tenosynovitis BT long:  Some surrounding fluid.  Intact Supraspinatus tendon:  There is a small calcification at the insertion of the supraspinatus, otherwise there is no obvious tearing.  No fluid in the bursa.  Subscapularis tendon: normal appearance without tears or degenerative changes Infraspinatus tendon:  normal appearance without tears or degenerative changes Teres Minor tendon:  normal appearance without tears or degenerative changes  Summary and Additional findings-biceps tendinitis and associated split tear   Left shoulder: No obvious deformity or swelling No tenderness over bicipital groove Full range of motion 5/5 strength with rotator cuff testing. Negative speeds   ASSESSMENT & PLAN:  1.  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  She does appear to have a small split tear on ultrasound which may be chronic and related to her previous pain several years ago.  However, she does have fluid surrounding the biceps tendon on exam today. - Patient started on home exercises -Continue ibuprofen or Aleve - We will start her on nitroglycerin patches -She will follow-up in 6 weeks  I was the preceptor for this visit and available for immediate consultation Shellia Cleverly, DO

## 2018-07-18 NOTE — Patient Instructions (Signed)
Your right arm pain is due to biceps tendinopathy. - Avoid activities with your arm outstretched overhead for the next several weeks. -Continue ibuprofen 600-800 mg every 8 hours or Aleve 2 tablets once daily as needed -Ice 15 minutes 3-4 times a day - We will start you on nitroglycerin patches - We will have you follow-up in 6 weeks   Nitroglycerin Protocol   Apply 1/4 nitroglycerin patch to affected area daily.  Change position of patch within the affected area every 24 hours.  You may experience a headache during the first 1-2 weeks of using the patch, these should subside.  If you experience headaches after beginning nitroglycerin patch treatment, you may take your preferred over the counter pain reliever.  Another side effect of the nitroglycerin patch is skin irritation or rash related to patch adhesive.  Please notify our office if you develop more severe headaches or rash, and stop the patch.  Tendon healing with nitroglycerin patch may require 12 to 24 weeks depending on the extent of injury.  Men should not use if taking Viagra, Cialis, or Levitra.   Do not use if you have migraines or rosacea.

## 2018-08-08 ENCOUNTER — Ambulatory Visit
Admission: RE | Admit: 2018-08-08 | Discharge: 2018-08-08 | Disposition: A | Discharge status: Home / Self Care | Payer: PPO | Source: Ambulatory Visit | Attending: Obstetrics & Gynecology | Admitting: Obstetrics & Gynecology

## 2018-08-08 DIAGNOSIS — Z1231 Encounter for screening mammogram for malignant neoplasm of breast: Secondary | ICD-10-CM

## 2018-08-29 ENCOUNTER — Encounter: Payer: Self-pay | Admitting: Sports Medicine

## 2018-08-29 ENCOUNTER — Ambulatory Visit: Payer: PPO | Admitting: Sports Medicine

## 2018-08-29 VITALS — BP 118/84 | Ht 65.5 in | Wt 126.0 lb

## 2018-08-29 DIAGNOSIS — M67921 Unspecified disorder of synovium and tendon, right upper arm: Secondary | ICD-10-CM | POA: Diagnosis not present

## 2018-08-29 NOTE — Patient Instructions (Addendum)
Glad you are doing better.  We will send you to physical therapy to have them work with you for a home exercise program to further progress. Continue the nitroglycerin patches for 4-6 more weeks Follow-up in 6 weeks if you are still having pain.

## 2018-08-29 NOTE — Progress Notes (Signed)
  Sonya Luna - 68 y.o. female MRN [id number]  Date of birth: 06/30/1951    SUBJECTIVE:      Chief Complaint: Right shoulder pain  HPI:  Sonya Luna is a 68 yo F who returns today for follow-up evaluation of her right bicipital tendinitis.  Patient stated that she has improved markedly, approximately 65%, since onset utilizing the nitroglycerin patch and Aleve.  She continues with physical therapy daily as recommended.  She request more detailed evaluation and explanation of limitations of use as she feels the exercises she was given are too light given her improvement. She denied return of the initial symptoms. She endorses persistent right proximal right biceps tendon area pain with shoulder extension and external rotation.   ROS:     See HPI  PERTINENT  PMH / PSH FH / / SH:  Past Medical, Surgical, Social, and Family History Reviewed & Updated in the EMR.  Pertinent findings include:    OBJECTIVE: BP 118/84   Ht 5' 5.5" (1.664 m)   Wt 126 lb (57.2 kg)   LMP 07/03/2002   BMI 20.65 kg/m   Physical Exam:  Vital signs are reviewed.  GEN: Alert and oriented, NAD Pulm: Breathing unlabored PSY: normal mood, congruent affect  MSK:  Right shoulder: No obvious deformity or asymmetry. No bruising. No swelling Tenderness over the bicipital groove persists today but improved.  No tenderness over the [facility] joint Full ROM in flexion, abduction, internal/external rotation NV intact distally Special Tests:  - Impingement: Negative Hawkins.  Negative Neers.  - Supraspinatus: Negative empty can.  5/5 strength - Infraspinatus/Teres: 5/5 strength with ER - Subscapularis: negative belly press, 5/5 strength with IR - Biceps tendon: Positive speeds.  Negative Yergason's - AC Joint: Negative cross arm  MSK [place]: Limited ultrasound of the right shoulder demonstrates continued evidence of tenosynovitis.  Stable appearing split tear within the tendon.  Left shoulder: No deformity No tenderness  to palpation Full range of motion with 5/5 strength with RTC testing   ASSESSMENT & PLAN:  1. Right biceps tendinopathy: Improving as expected. Pain persistent but minimally limiting. Current pain does not inhibit ADL's but inhibits more intense exercise.   Plan: We will refer to physical therapy for additional home exercise program. Continue the nitroglycerin patch Continue the ibuprofen or Aleve PRN Follow-up 6 weeks as needed    I saw and evaluated the patient with the resident.  I agree with the evaluation and treatment plan as discussed and edited the above note as necessary. Coralyn Helling, DO Sports Medicine Fellow  I was the preceptor for this visit and available for immediate consultation Shellia Cleverly, DO

## 2018-09-11 DIAGNOSIS — M7522 Bicipital tendinitis, left shoulder: Secondary | ICD-10-CM | POA: Diagnosis not present

## 2018-09-11 DIAGNOSIS — M65811 Other synovitis and tenosynovitis, right shoulder: Secondary | ICD-10-CM | POA: Diagnosis not present

## 2018-09-11 DIAGNOSIS — R531 Weakness: Secondary | ICD-10-CM | POA: Diagnosis not present

## 2018-11-04 DIAGNOSIS — R7301 Impaired fasting glucose: Secondary | ICD-10-CM | POA: Diagnosis not present

## 2018-11-04 DIAGNOSIS — R7989 Other specified abnormal findings of blood chemistry: Secondary | ICD-10-CM | POA: Diagnosis not present

## 2018-11-04 DIAGNOSIS — M859 Disorder of bone density and structure, unspecified: Secondary | ICD-10-CM | POA: Diagnosis not present

## 2018-11-05 DIAGNOSIS — R82998 Other abnormal findings in urine: Secondary | ICD-10-CM | POA: Diagnosis not present

## 2018-11-11 DIAGNOSIS — Z1331 Encounter for screening for depression: Secondary | ICD-10-CM | POA: Diagnosis not present

## 2018-11-11 DIAGNOSIS — D72819 Decreased white blood cell count, unspecified: Secondary | ICD-10-CM | POA: Diagnosis not present

## 2018-11-11 DIAGNOSIS — R030 Elevated blood-pressure reading, without diagnosis of hypertension: Secondary | ICD-10-CM | POA: Diagnosis not present

## 2018-11-11 DIAGNOSIS — M25511 Pain in right shoulder: Secondary | ICD-10-CM | POA: Diagnosis not present

## 2018-11-11 DIAGNOSIS — Z780 Asymptomatic menopausal state: Secondary | ICD-10-CM | POA: Diagnosis not present

## 2018-11-11 DIAGNOSIS — Z0000 Encounter for general adult medical examination without abnormal findings: Secondary | ICD-10-CM | POA: Diagnosis not present

## 2018-11-11 DIAGNOSIS — R7301 Impaired fasting glucose: Secondary | ICD-10-CM | POA: Diagnosis not present

## 2018-11-11 DIAGNOSIS — M8580 Other specified disorders of bone density and structure, unspecified site: Secondary | ICD-10-CM | POA: Diagnosis not present

## 2018-12-17 DIAGNOSIS — D2271 Melanocytic nevi of right lower limb, including hip: Secondary | ICD-10-CM | POA: Diagnosis not present

## 2018-12-17 DIAGNOSIS — L218 Other seborrheic dermatitis: Secondary | ICD-10-CM | POA: Diagnosis not present

## 2018-12-17 DIAGNOSIS — D224 Melanocytic nevi of scalp and neck: Secondary | ICD-10-CM | POA: Diagnosis not present

## 2018-12-17 DIAGNOSIS — D225 Melanocytic nevi of trunk: Secondary | ICD-10-CM | POA: Diagnosis not present

## 2018-12-17 DIAGNOSIS — D22121 Melanocytic nevi of left upper eyelid, including canthus: Secondary | ICD-10-CM | POA: Diagnosis not present

## 2018-12-17 DIAGNOSIS — L821 Other seborrheic keratosis: Secondary | ICD-10-CM | POA: Diagnosis not present

## 2019-01-21 ENCOUNTER — Other Ambulatory Visit: Payer: Self-pay | Admitting: Infectious Diseases

## 2019-01-21 DIAGNOSIS — U071 COVID-19: Secondary | ICD-10-CM

## 2019-01-22 ENCOUNTER — Other Ambulatory Visit: Payer: Self-pay

## 2019-01-22 DIAGNOSIS — Z20822 Contact with and (suspected) exposure to covid-19: Secondary | ICD-10-CM

## 2019-01-25 LAB — NOVEL CORONAVIRUS, NAA: SARS-CoV-2, NAA: NOT DETECTED

## 2019-03-11 ENCOUNTER — Other Ambulatory Visit: Payer: Self-pay

## 2019-03-13 ENCOUNTER — Ambulatory Visit (INDEPENDENT_AMBULATORY_CARE_PROVIDER_SITE_OTHER): Payer: PPO | Admitting: Obstetrics & Gynecology

## 2019-03-13 ENCOUNTER — Encounter: Payer: Self-pay | Admitting: Obstetrics & Gynecology

## 2019-03-13 ENCOUNTER — Other Ambulatory Visit: Payer: Self-pay

## 2019-03-13 VITALS — BP 128/80 | HR 64 | Temp 97.6°F | Ht 65.25 in | Wt 128.8 lb

## 2019-03-13 DIAGNOSIS — Z01419 Encounter for gynecological examination (general) (routine) without abnormal findings: Secondary | ICD-10-CM

## 2019-03-13 NOTE — Progress Notes (Signed)
68 y.o. G36P4 Married White or Caucasian female here for annual exam.  Doing well.  Has a friend with Covid right now who has been sick but is starting to feel better.  Was able to see son and his wife and their two grandchildren.  Had to have Covid testing in July for the trip to [place].  They will try again in November.    Denies vaginal bleeding.  Has just gotten her flu shot.    PCP:  Dr. Joylene Draft.  Did blood work with Perini prior to video visit.    Patient's last menstrual period was 07/03/2002.          Sexually active: Yes.    The current method of family planning is post menopausal status.    Exercising: Yes.    walking 4 miles daily, yoga Smoker:  no  Health Maintenance: Pap:  10/16/16 Neg. HR HPV:neg   08/21/14 Neg  History of abnormal Pap:  no MMG:  08/08/18 BIRADS1:Neg Colonoscopy:  12/03/13 normal. F/u 10 years  BMD:   05/10/15 TDaP:  2017 Pneumonia vaccine(s):  Done  Shingrix:   Completed  Hep C testing: 10/16/16 Neg  Screening Labs: PCP   reports that she has never smoked. She has never used smokeless tobacco. She reports current alcohol use of about 6.0 - 8.0 standard drinks of alcohol per week. She reports that she does not use drugs.  Past Medical History:  Diagnosis Date  . Bone spur of foot    right foot  . Mallet finger    middle finger-right hand    Past Surgical History:  Procedure Laterality Date  . FOOT SURGERY     outpatient-for bone spur right foot  . FOOT SURGERY     for bone spur-left foot    Current Outpatient Medications  Medication Sig Dispense Refill  . Multiple Vitamin (MULTIVITAMIN) tablet Take 1 tablet by mouth daily.    . naproxen sodium (ALEVE) 220 MG tablet Take 220 mg by mouth daily as needed.    . nitroGLYCERIN (NITRODUR - DOSED IN MG/24 HR) 0.2 mg/hr patch Use 1/4 patch daily to the affected area. 30 patch 0  . zolpidem (AMBIEN) 5 MG tablet Take 1 tablet by mouth as needed.     No current facility-administered medications for this  visit.     Family History  Problem Relation Age of Onset  . Diabetes Maternal Grandfather   . Cervical cancer Paternal Grandmother   . Heart attack Father        smoker-had heart surgery  . Breast cancer Neg Hx     Review of Systems  All other systems reviewed and are negative.   Exam:   BP 128/80   Pulse 64   Temp 97.6 F (36.4 C) (Temporal)   Ht 5' 5.25" (1.657 m)   Wt 128 lb 12.8 oz (58.4 kg)   LMP 07/03/2002   BMI 21.27 kg/m     Height: 5' 5.25" (165.7 cm)  Ht Readings from Last 3 Encounters:  03/13/19 5' 5.25" (1.657 m)  08/29/18 5' 5.5" (1.664 m)  07/18/18 5' 5.5" (1.664 m)    General appearance: alert, cooperative and appears stated age Head: Normocephalic, without obvious abnormality, atraumatic Neck: no adenopathy, supple, symmetrical, trachea midline and thyroid normal to inspection and palpation Lungs: clear to auscultation bilaterally Breasts: normal appearance, no masses or tenderness Heart: regular rate and rhythm Abdomen: soft, non-tender; bowel sounds normal; no masses,  no organomegaly Extremities: extremities normal, atraumatic, no  cyanosis or edema Skin: Skin color, texture, turgor normal. No rashes or lesions Lymph nodes: Cervical, supraclavicular, and axillary nodes normal. No abnormal inguinal nodes palpated Neurologic: Grossly normal  Pelvic: External genitalia:  no lesions              Urethra:  normal appearing urethra with no masses, tenderness or lesions              Bartholins and Skenes: normal                 Vagina: normal appearing vagina with normal color and discharge, no lesions              Cervix: no lesions              Pap taken: No. Bimanual Exam:  Uterus:  normal size, contour, position, consistency, mobility, non-tender              Adnexa: normal adnexa and no mass, fullness, tenderness               Rectovaginal: Confirms               Anus:  normal sphincter tone, no lesions  Chaperone was present for exam.  A:   Well Woman with normal exam PMP, no HRT  P:   Mammogram guidelines reviewed.   pap smear not indicated today Lab work and vaccines are UTD with Dr. Joylene Draft Colonoscopy screening UTD Plan BMD next year return annually or prn

## 2019-05-26 ENCOUNTER — Other Ambulatory Visit: Payer: Self-pay

## 2019-05-26 DIAGNOSIS — Z20822 Contact with and (suspected) exposure to covid-19: Secondary | ICD-10-CM

## 2019-05-27 LAB — NOVEL CORONAVIRUS, NAA: SARS-CoV-2, NAA: NOT DETECTED

## 2019-06-04 DIAGNOSIS — H04123 Dry eye syndrome of bilateral lacrimal glands: Secondary | ICD-10-CM | POA: Diagnosis not present

## 2019-06-23 ENCOUNTER — Ambulatory Visit: Payer: PPO | Attending: Internal Medicine

## 2019-06-23 DIAGNOSIS — Z20822 Contact with and (suspected) exposure to covid-19: Secondary | ICD-10-CM

## 2019-06-23 DIAGNOSIS — Z20828 Contact with and (suspected) exposure to other viral communicable diseases: Secondary | ICD-10-CM | POA: Diagnosis not present

## 2019-06-24 LAB — NOVEL CORONAVIRUS, NAA: SARS-CoV-2, NAA: NOT DETECTED

## 2019-07-22 ENCOUNTER — Ambulatory Visit: Payer: PPO

## 2019-07-23 ENCOUNTER — Ambulatory Visit: Payer: PPO | Attending: Internal Medicine

## 2019-07-23 ENCOUNTER — Other Ambulatory Visit: Payer: Self-pay

## 2019-07-23 DIAGNOSIS — Z23 Encounter for immunization: Secondary | ICD-10-CM | POA: Insufficient documentation

## 2019-07-23 NOTE — Progress Notes (Signed)
   Covid-19 Vaccination Clinic  Name:  Sonya Luna    MRN: [id number] DOB: May 17, 1951  07/23/2019  [name] was observed post Covid-19 immunization for 15 minutes without incidence. She was provided with Vaccine Information Sheet and instruction to access the V-Safe system.   [name] was instructed to call 911 with any severe reactions post vaccine: Marland Kitchen Difficulty breathing  . Swelling of your face and throat  . A fast heartbeat  . A bad rash all over your body  . Dizziness and weakness    Immunizations Administered    Name Date Dose VIS Date Route   Pfizer COVID-19 Vaccine 07/23/2019  8:24 AM 0.3 mL 06/13/2019 Intramuscular   Manufacturer: [place]   Lot: [id number]   [place]: [id number]

## 2019-08-07 ENCOUNTER — Ambulatory Visit: Payer: PPO

## 2019-08-13 ENCOUNTER — Ambulatory Visit: Payer: PPO | Attending: Internal Medicine

## 2019-08-13 DIAGNOSIS — Z23 Encounter for immunization: Secondary | ICD-10-CM | POA: Insufficient documentation

## 2019-08-13 NOTE — Progress Notes (Signed)
   Covid-19 Vaccination Clinic  Name:  KRUTHI BONDOC    MRN: [id number] DOB: 07-30-1950  08/13/2019  Ms. Caler was observed post Covid-19 immunization for 15 minutes without incidence. She was provided with Vaccine Information Sheet and instruction to access the V-Safe system.   Ms. Colacino was instructed to call 911 with any severe reactions post vaccine: Marland Kitchen Difficulty breathing  . Swelling of your face and throat  . A fast heartbeat  . A bad rash all over your body  . Dizziness and weakness    Immunizations Administered    Name Date Dose VIS Date Route   Pfizer COVID-19 Vaccine 08/13/2019 10:23 AM 0.3 mL 06/13/2019 Intramuscular   Manufacturer: [place]   Lot: [id number]   [place]: [id number]

## 2019-08-14 ENCOUNTER — Other Ambulatory Visit: Payer: Self-pay | Admitting: Obstetrics & Gynecology

## 2019-08-14 DIAGNOSIS — Z1231 Encounter for screening mammogram for malignant neoplasm of breast: Secondary | ICD-10-CM

## 2019-09-18 ENCOUNTER — Ambulatory Visit: Payer: PPO

## 2019-10-03 ENCOUNTER — Other Ambulatory Visit: Payer: Self-pay

## 2019-10-03 ENCOUNTER — Ambulatory Visit
Admission: RE | Admit: 2019-10-03 | Discharge: 2019-10-03 | Disposition: A | Discharge status: Home / Self Care | Payer: PPO | Source: Ambulatory Visit | Attending: Obstetrics & Gynecology | Admitting: Obstetrics & Gynecology

## 2019-10-03 DIAGNOSIS — Z1231 Encounter for screening mammogram for malignant neoplasm of breast: Secondary | ICD-10-CM

## 2019-12-10 DIAGNOSIS — R030 Elevated blood-pressure reading, without diagnosis of hypertension: Secondary | ICD-10-CM | POA: Diagnosis not present

## 2019-12-10 DIAGNOSIS — R7989 Other specified abnormal findings of blood chemistry: Secondary | ICD-10-CM | POA: Diagnosis not present

## 2019-12-10 DIAGNOSIS — R7301 Impaired fasting glucose: Secondary | ICD-10-CM | POA: Diagnosis not present

## 2019-12-10 DIAGNOSIS — M859 Disorder of bone density and structure, unspecified: Secondary | ICD-10-CM | POA: Diagnosis not present

## 2019-12-17 DIAGNOSIS — Z1331 Encounter for screening for depression: Secondary | ICD-10-CM | POA: Diagnosis not present

## 2019-12-17 DIAGNOSIS — R030 Elevated blood-pressure reading, without diagnosis of hypertension: Secondary | ICD-10-CM | POA: Diagnosis not present

## 2019-12-17 DIAGNOSIS — M8580 Other specified disorders of bone density and structure, unspecified site: Secondary | ICD-10-CM | POA: Diagnosis not present

## 2019-12-17 DIAGNOSIS — R7301 Impaired fasting glucose: Secondary | ICD-10-CM | POA: Diagnosis not present

## 2019-12-17 DIAGNOSIS — R82998 Other abnormal findings in urine: Secondary | ICD-10-CM | POA: Diagnosis not present

## 2019-12-17 DIAGNOSIS — Z0000 Encounter for general adult medical examination without abnormal findings: Secondary | ICD-10-CM | POA: Diagnosis not present

## 2019-12-17 DIAGNOSIS — Z1212 Encounter for screening for malignant neoplasm of rectum: Secondary | ICD-10-CM | POA: Diagnosis not present

## 2019-12-17 DIAGNOSIS — E785 Hyperlipidemia, unspecified: Secondary | ICD-10-CM | POA: Diagnosis not present

## 2019-12-17 DIAGNOSIS — D7589 Other specified diseases of blood and blood-forming organs: Secondary | ICD-10-CM | POA: Diagnosis not present

## 2020-02-04 DIAGNOSIS — R05 Cough: Secondary | ICD-10-CM | POA: Diagnosis not present

## 2020-02-04 DIAGNOSIS — Z1152 Encounter for screening for COVID-19: Secondary | ICD-10-CM | POA: Diagnosis not present

## 2020-05-17 ENCOUNTER — Ambulatory Visit: Payer: PPO | Admitting: Podiatry

## 2020-05-18 ENCOUNTER — Ambulatory Visit: Payer: PPO | Admitting: Obstetrics & Gynecology

## 2020-05-26 ENCOUNTER — Ambulatory Visit: Payer: PPO | Admitting: Podiatry

## 2020-05-26 ENCOUNTER — Encounter: Payer: Self-pay | Admitting: Podiatry

## 2020-05-26 ENCOUNTER — Ambulatory Visit (INDEPENDENT_AMBULATORY_CARE_PROVIDER_SITE_OTHER): Payer: PPO

## 2020-05-26 ENCOUNTER — Other Ambulatory Visit: Payer: Self-pay

## 2020-05-26 DIAGNOSIS — M7751 Other enthesopathy of right foot: Secondary | ICD-10-CM | POA: Diagnosis not present

## 2020-05-26 DIAGNOSIS — M21619 Bunion of unspecified foot: Secondary | ICD-10-CM

## 2020-05-26 DIAGNOSIS — M778 Other enthesopathies, not elsewhere classified: Secondary | ICD-10-CM

## 2020-05-26 DIAGNOSIS — Z472 Encounter for removal of internal fixation device: Secondary | ICD-10-CM

## 2020-05-26 DIAGNOSIS — M7752 Other enthesopathy of left foot: Secondary | ICD-10-CM | POA: Diagnosis not present

## 2020-05-27 NOTE — Progress Notes (Signed)
Subjective:   Patient ID: Sonya Luna, female   DOB: 69 y.o.   MRN: [id number]   HPI Patient states overall I done pretty well but over the last couple of years I noticed increase in bulk on the left first metatarsal and that has been painful strictly to pressure from walking.  Overall I am not having pain in the joints anymore at this time.  Patient does not smoke likes to be active   Review of Systems  All other systems reviewed and are negative.       Objective:  Physical Exam Vitals and nursing note reviewed.  Constitutional:      Appearance: She is well-developed.  Pulmonary:     Effort: Pulmonary effort is normal.  Musculoskeletal:        General: Normal range of motion.  Skin:    General: Skin is warm.  Neurological:     Mental Status: She is alert.     Neurovascular status intact muscle strength was found to be adequate range of motion adequate.  Patient does have significant loss of motion of the first metatarsal phalangeal joint right over left with no motion right moderate motion left but no crepitus.  There is a prominence on the dorsal medial aspect of the first metatarsal head left that appears to be strictly pressure from shoe gear they get irritated and there is pins that may be irritating her on the left foot.  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  I do think we could just go ahead and smooth the bump off the left foot along with remove pins and this would be the best solution for this patient.  I think it would give her relief of the pressure discomfort she is experiencing cannot affect her negatively even though the possibility of fusion does exist someday.  At  this point I allowed her to read consent form going over the procedure all possible complications and alternative treatments.  She wants this to be done and I recommended removal of the bone plus pins and this was explained to her in great detail and she is scheduled for outpatient procedure  X-rays indicate that there is significant arthritis of the first MPJ bilateral and there is pins present in the first metatarsal left

## 2020-07-12 ENCOUNTER — Telehealth: Payer: Self-pay

## 2020-07-12 NOTE — Telephone Encounter (Signed)
DOS 07/20/2020  [name] BUNIONECTOMY LT - 40814 REMOVAL FIXATION DEEP X2 LT - 20680  Sonya Luna FROM [facility], [facility] # [phone] GOOD FROM 07/20/2020 - 10/18/2020.

## 2020-07-15 DIAGNOSIS — R030 Elevated blood-pressure reading, without diagnosis of hypertension: Secondary | ICD-10-CM | POA: Diagnosis not present

## 2020-07-20 ENCOUNTER — Other Ambulatory Visit: Payer: Self-pay | Admitting: Podiatry

## 2020-07-20 ENCOUNTER — Encounter: Payer: Self-pay | Admitting: Podiatry

## 2020-07-20 DIAGNOSIS — M2012 Hallux valgus (acquired), left foot: Secondary | ICD-10-CM | POA: Diagnosis not present

## 2020-07-20 DIAGNOSIS — Z472 Encounter for removal of internal fixation device: Secondary | ICD-10-CM | POA: Diagnosis not present

## 2020-07-20 DIAGNOSIS — Z4889 Encounter for other specified surgical aftercare: Secondary | ICD-10-CM | POA: Diagnosis not present

## 2020-07-20 MED ORDER — HYDROCODONE-ACETAMINOPHEN 10-325 MG PO TABS
1.0000 | ORAL_TABLET | Freq: Three times a day (TID) | ORAL | 0 refills | Status: AC | PRN
Start: 2020-07-20 — End: 2020-07-25

## 2020-07-21 DIAGNOSIS — R030 Elevated blood-pressure reading, without diagnosis of hypertension: Secondary | ICD-10-CM | POA: Diagnosis not present

## 2020-07-26 ENCOUNTER — Ambulatory Visit (INDEPENDENT_AMBULATORY_CARE_PROVIDER_SITE_OTHER): Payer: PPO | Admitting: Podiatry

## 2020-07-26 ENCOUNTER — Encounter: Payer: PPO | Admitting: Podiatry

## 2020-07-26 ENCOUNTER — Other Ambulatory Visit: Payer: Self-pay

## 2020-07-26 ENCOUNTER — Ambulatory Visit (INDEPENDENT_AMBULATORY_CARE_PROVIDER_SITE_OTHER): Payer: PPO

## 2020-07-26 ENCOUNTER — Encounter: Payer: Self-pay | Admitting: Podiatry

## 2020-07-26 DIAGNOSIS — M21619 Bunion of unspecified foot: Secondary | ICD-10-CM

## 2020-07-26 DIAGNOSIS — M778 Other enthesopathies, not elsewhere classified: Secondary | ICD-10-CM

## 2020-07-26 DIAGNOSIS — M21612 Bunion of left foot: Secondary | ICD-10-CM

## 2020-07-27 NOTE — Progress Notes (Signed)
Subjective:   Patient ID: Sonya Luna, female   DOB: 70 y.o.   MRN: [id number]   HPI Patient states she is healing well and stated she walked 2 miles this morning and has been very active neuro   ROS      Objective:  Physical Exam  Vascular status intact negative Bevelyn Buckles' sign noted wound edges coapted well slight redness is more friction due to the activity that she has been undertaking     Assessment:  Overall doing well post [name] bunionectomy removal of fixation x2 left     Plan:  Reviewed x-ray advised on reduced activities continued elevation and reappoint for [place] to recheck again and encouraged to call questions concerns  X-rays indicate satisfactory resection of bone with no other pathology noted

## 2020-07-28 ENCOUNTER — Encounter: Payer: PPO | Admitting: Podiatry

## 2020-08-25 DIAGNOSIS — L821 Other seborrheic keratosis: Secondary | ICD-10-CM | POA: Diagnosis not present

## 2020-08-25 DIAGNOSIS — L720 Epidermal cyst: Secondary | ICD-10-CM | POA: Diagnosis not present

## 2020-08-25 DIAGNOSIS — D2271 Melanocytic nevi of right lower limb, including hip: Secondary | ICD-10-CM | POA: Diagnosis not present

## 2020-08-25 DIAGNOSIS — D225 Melanocytic nevi of trunk: Secondary | ICD-10-CM | POA: Diagnosis not present

## 2020-08-25 DIAGNOSIS — D220 Melanocytic nevi of lip: Secondary | ICD-10-CM | POA: Diagnosis not present

## 2020-08-25 DIAGNOSIS — D2261 Melanocytic nevi of right upper limb, including shoulder: Secondary | ICD-10-CM | POA: Diagnosis not present

## 2020-08-25 DIAGNOSIS — D2272 Melanocytic nevi of left lower limb, including hip: Secondary | ICD-10-CM | POA: Diagnosis not present

## 2020-08-25 DIAGNOSIS — L218 Other seborrheic dermatitis: Secondary | ICD-10-CM | POA: Diagnosis not present

## 2020-08-25 DIAGNOSIS — D2262 Melanocytic nevi of left upper limb, including shoulder: Secondary | ICD-10-CM | POA: Diagnosis not present

## 2020-09-16 ENCOUNTER — Other Ambulatory Visit: Payer: Self-pay | Admitting: Obstetrics & Gynecology

## 2020-09-16 DIAGNOSIS — Z1231 Encounter for screening mammogram for malignant neoplasm of breast: Secondary | ICD-10-CM

## 2020-11-10 ENCOUNTER — Ambulatory Visit
Admission: RE | Admit: 2020-11-10 | Discharge: 2020-11-10 | Disposition: A | Discharge status: Home / Self Care | Payer: PPO | Source: Ambulatory Visit | Attending: Obstetrics & Gynecology | Admitting: Obstetrics & Gynecology

## 2020-11-10 ENCOUNTER — Other Ambulatory Visit: Payer: Self-pay

## 2020-11-10 DIAGNOSIS — Z1231 Encounter for screening mammogram for malignant neoplasm of breast: Secondary | ICD-10-CM | POA: Diagnosis not present

## 2021-01-04 DIAGNOSIS — R7301 Impaired fasting glucose: Secondary | ICD-10-CM | POA: Diagnosis not present

## 2021-01-04 DIAGNOSIS — E785 Hyperlipidemia, unspecified: Secondary | ICD-10-CM | POA: Diagnosis not present

## 2021-01-04 DIAGNOSIS — M859 Disorder of bone density and structure, unspecified: Secondary | ICD-10-CM | POA: Diagnosis not present

## 2021-01-11 DIAGNOSIS — R82998 Other abnormal findings in urine: Secondary | ICD-10-CM | POA: Diagnosis not present

## 2021-01-11 DIAGNOSIS — E785 Hyperlipidemia, unspecified: Secondary | ICD-10-CM | POA: Diagnosis not present

## 2021-01-11 DIAGNOSIS — Z1212 Encounter for screening for malignant neoplasm of rectum: Secondary | ICD-10-CM | POA: Diagnosis not present

## 2021-01-18 DIAGNOSIS — Z0000 Encounter for general adult medical examination without abnormal findings: Secondary | ICD-10-CM | POA: Diagnosis not present

## 2021-01-18 DIAGNOSIS — E785 Hyperlipidemia, unspecified: Secondary | ICD-10-CM | POA: Diagnosis not present

## 2021-01-18 DIAGNOSIS — M8580 Other specified disorders of bone density and structure, unspecified site: Secondary | ICD-10-CM | POA: Diagnosis not present

## 2021-01-18 DIAGNOSIS — Z1331 Encounter for screening for depression: Secondary | ICD-10-CM | POA: Diagnosis not present

## 2021-01-18 DIAGNOSIS — R7301 Impaired fasting glucose: Secondary | ICD-10-CM | POA: Diagnosis not present

## 2021-01-18 DIAGNOSIS — M2010 Hallux valgus (acquired), unspecified foot: Secondary | ICD-10-CM | POA: Diagnosis not present

## 2021-01-18 DIAGNOSIS — R030 Elevated blood-pressure reading, without diagnosis of hypertension: Secondary | ICD-10-CM | POA: Diagnosis not present

## 2021-01-19 ENCOUNTER — Other Ambulatory Visit: Payer: Self-pay | Admitting: Internal Medicine

## 2021-01-19 DIAGNOSIS — E785 Hyperlipidemia, unspecified: Secondary | ICD-10-CM

## 2021-01-28 DIAGNOSIS — H527 Unspecified disorder of refraction: Secondary | ICD-10-CM | POA: Diagnosis not present

## 2021-01-28 DIAGNOSIS — H2513 Age-related nuclear cataract, bilateral: Secondary | ICD-10-CM | POA: Diagnosis not present

## 2021-01-28 DIAGNOSIS — H16141 Punctate keratitis, right eye: Secondary | ICD-10-CM | POA: Diagnosis not present

## 2021-01-28 DIAGNOSIS — H04123 Dry eye syndrome of bilateral lacrimal glands: Secondary | ICD-10-CM | POA: Diagnosis not present

## 2021-02-17 DIAGNOSIS — M8589 Other specified disorders of bone density and structure, multiple sites: Secondary | ICD-10-CM | POA: Diagnosis not present

## 2021-02-18 ENCOUNTER — Ambulatory Visit
Admission: RE | Admit: 2021-02-18 | Discharge: 2021-02-18 | Disposition: A | Discharge status: Home / Self Care | Payer: No Typology Code available for payment source | Source: Ambulatory Visit | Attending: Internal Medicine | Admitting: Internal Medicine

## 2021-02-18 DIAGNOSIS — E785 Hyperlipidemia, unspecified: Secondary | ICD-10-CM

## 2021-04-12 DIAGNOSIS — N390 Urinary tract infection, site not specified: Secondary | ICD-10-CM | POA: Diagnosis not present

## 2021-05-12 ENCOUNTER — Other Ambulatory Visit: Payer: Self-pay

## 2021-05-12 ENCOUNTER — Encounter (HOSPITAL_BASED_OUTPATIENT_CLINIC_OR_DEPARTMENT_OTHER): Payer: Self-pay | Admitting: Obstetrics & Gynecology

## 2021-05-12 ENCOUNTER — Other Ambulatory Visit (HOSPITAL_COMMUNITY)
Admission: RE | Admit: 2021-05-12 | Discharge: 2021-05-12 | Disposition: A | Discharge status: Home / Self Care | Payer: PPO | Source: Ambulatory Visit | Attending: Obstetrics & Gynecology | Admitting: Obstetrics & Gynecology

## 2021-05-12 ENCOUNTER — Ambulatory Visit (INDEPENDENT_AMBULATORY_CARE_PROVIDER_SITE_OTHER): Payer: PPO | Admitting: Obstetrics & Gynecology

## 2021-05-12 VITALS — BP 154/98 | HR 64 | Ht 65.5 in | Wt 132.2 lb

## 2021-05-12 DIAGNOSIS — Z124 Encounter for screening for malignant neoplasm of cervix: Secondary | ICD-10-CM | POA: Diagnosis not present

## 2021-05-12 DIAGNOSIS — Z780 Asymptomatic menopausal state: Secondary | ICD-10-CM | POA: Diagnosis not present

## 2021-05-12 DIAGNOSIS — I10 Essential (primary) hypertension: Secondary | ICD-10-CM | POA: Diagnosis not present

## 2021-05-12 DIAGNOSIS — Z9189 Other specified personal risk factors, not elsewhere classified: Secondary | ICD-10-CM

## 2021-05-12 NOTE — Progress Notes (Signed)
70 y.o. G57P4 Married White or Caucasian female here for breast and pelvic exam.  Denies vaginal bleeding.  Reports she had elevated BP when saw Dr. Joylene Draft.  It is elevated today as well.  She reports she wore a 24 hour BP monitor and this was all normal.    Had BMD earlier this year with Dr. Joylene Draft.    Patient's last menstrual period was 07/03/2002.          Sexually active: Yes.    H/O STD:  no  Health Maintenance: PCP:  Dr. Crist Infante.  Last wellness appt was during the past 3 months.  Did blood work at that appt:  yes Vaccines are up to date:  yes but I do not have the most recent Covid booster date for her Colonoscopy:  12/03/2013, follow up 10 years MMG:  11/10/2020 Negative BMD:  2022 with Dr. Joylene Draft Last pap smear:  10/17/2014 Negative H/o abnormal pap smear:  No   reports that she has never smoked. She has never used smokeless tobacco. She reports current alcohol use of about 6.0 - 8.0 standard drinks per week. She reports that she does not use drugs.  Past Medical History:  Diagnosis Date   Bone spur of foot    right foot   Mallet finger    middle finger-right hand    Past Surgical History:  Procedure Laterality Date   FOOT SURGERY Right    bone spur removal   FOOT SURGERY Left    for bone spur    Current Outpatient Medications  Medication Sig Dispense Refill   Multiple Vitamin (MULTIVITAMIN) tablet Take 1 tablet by mouth daily.     zolpidem (AMBIEN) 5 MG tablet Take 1 tablet by mouth as needed.     FLUAD QUADRIVALENT 0.5 ML injection  (Patient not taking: Reported on 05/12/2021)     mupirocin ointment (BACTROBAN) 2 % SMARTSIG:1 Application Topical 2-3 Times Daily (Patient not taking: Reported on 05/12/2021)     naproxen sodium (ALEVE) 220 MG tablet Take 220 mg by mouth daily as needed. (Patient not taking: Reported on 05/12/2021)     nitroGLYCERIN (NITRODUR - DOSED IN MG/24 HR) 0.2 mg/hr patch Use 1/4 patch daily to the affected area. (Patient not taking:  Reported on 05/12/2021) 30 patch 0   No current facility-administered medications for this visit.    Family History  Problem Relation Age of Onset   Diabetes Maternal Grandfather    Cervical cancer Paternal Grandmother    Heart attack Father        smoker-had heart surgery   Breast cancer Neg Hx     Review of Systems  All other systems reviewed and are negative.  Exam:   BP (!) 154/98 (BP Location: Left Arm, Patient Position: Sitting, Cuff Size: Normal)   Pulse 64   Ht 5' 5.5" (1.664 m) Comment: reported  Wt 132 lb 3.2 oz (60 kg)   LMP 07/03/2002   BMI 21.66 kg/m   Height: 5' 5.5" (166.4 cm) (reported)  General appearance: alert, cooperative and appears stated age Breasts: normal appearance, no masses or tenderness Abdomen: soft, non-tender; bowel sounds normal; no masses,  no organomegaly Lymph nodes: Cervical, supraclavicular, and axillary nodes normal.  No abnormal inguinal nodes palpated Neurologic: Grossly normal  Pelvic: External genitalia:  no lesions              Urethra:  normal appearing urethra with no masses, tenderness or lesions  Bartholins and Skenes: normal                 Vagina: normal appearing vagina with atrophic changes and no discharge, no lesions              Cervix: no lesions              Pap taken: Yes.   Bimanual Exam:  Uterus:  normal size, contour, position, consistency, mobility, non-tender              Adnexa: normal adnexa and no mass, fullness, tenderness               Rectovaginal: Confirms               Anus:  normal sphincter tone, no lesions  Chaperone, Ezekiel Ina, RN, was present for exam.  Assessment/Plan: 1. GYN exam for high-risk Medicare patient - pap obtained today  - MMG 10/2020 - BMD 2022 with Dr. Merryl Hacker - lab work done with Dr. Joylene Draft - colonoscopy 12/2013, follow up 10 years - care gaps updated/reviewed including vaccinations.  Pt is going get me dates of what is missing - can follow up 1-2 years  2.  Postmenopausal - no HRT  3. White coat syndrome with diagnosis of hypertension - pt will continue to monitor at home

## 2021-05-13 LAB — CYTOLOGY - PAP: Diagnosis: NEGATIVE

## 2021-05-15 DIAGNOSIS — I10 Essential (primary) hypertension: Secondary | ICD-10-CM | POA: Insufficient documentation

## 2021-05-15 DIAGNOSIS — Z780 Asymptomatic menopausal state: Secondary | ICD-10-CM | POA: Insufficient documentation

## 2021-09-14 DIAGNOSIS — L821 Other seborrheic keratosis: Secondary | ICD-10-CM | POA: Diagnosis not present

## 2021-09-14 DIAGNOSIS — D2271 Melanocytic nevi of right lower limb, including hip: Secondary | ICD-10-CM | POA: Diagnosis not present

## 2021-09-14 DIAGNOSIS — D225 Melanocytic nevi of trunk: Secondary | ICD-10-CM | POA: Diagnosis not present

## 2021-09-14 DIAGNOSIS — L218 Other seborrheic dermatitis: Secondary | ICD-10-CM | POA: Diagnosis not present

## 2021-12-14 ENCOUNTER — Other Ambulatory Visit: Payer: Self-pay | Admitting: Obstetrics & Gynecology

## 2021-12-14 DIAGNOSIS — Z1231 Encounter for screening mammogram for malignant neoplasm of breast: Secondary | ICD-10-CM

## 2021-12-15 ENCOUNTER — Ambulatory Visit
Admission: RE | Admit: 2021-12-15 | Discharge: 2021-12-15 | Disposition: A | Discharge status: Home / Self Care | Payer: PPO | Source: Ambulatory Visit | Attending: Obstetrics & Gynecology | Admitting: Obstetrics & Gynecology

## 2021-12-15 DIAGNOSIS — Z1231 Encounter for screening mammogram for malignant neoplasm of breast: Secondary | ICD-10-CM | POA: Diagnosis not present

## 2022-01-19 ENCOUNTER — Ambulatory Visit (INDEPENDENT_AMBULATORY_CARE_PROVIDER_SITE_OTHER): Payer: PPO | Admitting: Sports Medicine

## 2022-01-19 VITALS — BP 138/96 | Ht 66.0 in | Wt 128.0 lb

## 2022-01-19 DIAGNOSIS — M25562 Pain in left knee: Secondary | ICD-10-CM

## 2022-01-19 MED ORDER — MELOXICAM 15 MG PO TABS: ORAL_TABLET | ORAL | 0 refills | Status: DC

## 2022-01-19 NOTE — Progress Notes (Signed)
   Subjective:    Patient ID: Sonya Luna, female    DOB: [birth year], 71 y.o.   MRN: [id number]  HPI chief complaint: Left knee pain  Patient is a very pleasant 71 year old female that comes in today complaining of 2 weeks of left knee pain.  She does not recall any specific injury.  She did however notice pain after she was having to assist her elderly mom ambulate.  She initially had pain in the posterior aspect of the knee but now also has pain in the medial knee.  It is worse with walking.  It is also most noticeable when walking with a heel-to-toe gait.  She has not noticed any swelling but does get some stiffness and a feeling of tightness in the knee.  No mechanical symptoms.  No problems with her knee in the past.  No prior knee surgeries.  She has tried ice which has been helpful.  Over-the-counter Aleve has not been helpful.  She enjoys recreational walking as well as spin.  She also enjoys [employer] type exercises.  Past medical history reviewed Medications reviewed Allergies reviewed    Review of Systems As above    Objective:   Physical Exam  Well-developed, well-nourished.  No acute distress.  Left knee: Good range of motion.  No effusion.  She is tender to palpation along the posterior medial knee at the origin of the gastrocnemius.  No palpable Baker's cyst.  She is also tender to palpation along the medial joint line with an equivocal Thessaly's.  Negative McMurray's.  Trace patellofemoral crepitus.  Knee is grossly stable to ligamentous exam.  Neurovascularly intact distally.  Walking with a slight limp.  Limited MSK ultrasound of the left knee shows no obvious abnormality of the proximal medial head of the gastrocnemius.  No Baker's cyst.  There is a small joint effusion and some slight spurring along the medial joint space.  No obvious meniscus tear.      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  Of also recommended a compression sleeve for the left knee to be worn when active.  The patient will need to avoid a lot of impact exercise such as recreational walking or [employer] type exercises until her symptoms improve.  She may continue with spin using pain as her guide.  She will follow-up with me again in 3 weeks for reevaluation.  If symptoms persist then consider merits of diagnostic/therapeutic cortisone injection at that time.  She is encouraged to call with questions or concerns in the interim.  This note was dictated using Dragon naturally speaking software and may contain errors in syntax, spelling, or content which have not been identified prior to signing this note.

## 2022-01-30 DIAGNOSIS — H0102A Squamous blepharitis right eye, upper and lower eyelids: Secondary | ICD-10-CM | POA: Diagnosis not present

## 2022-01-30 DIAGNOSIS — H2513 Age-related nuclear cataract, bilateral: Secondary | ICD-10-CM | POA: Diagnosis not present

## 2022-01-30 DIAGNOSIS — H04123 Dry eye syndrome of bilateral lacrimal glands: Secondary | ICD-10-CM | POA: Diagnosis not present

## 2022-01-30 DIAGNOSIS — H0102B Squamous blepharitis left eye, upper and lower eyelids: Secondary | ICD-10-CM | POA: Diagnosis not present

## 2022-02-01 DIAGNOSIS — R7989 Other specified abnormal findings of blood chemistry: Secondary | ICD-10-CM | POA: Diagnosis not present

## 2022-02-01 DIAGNOSIS — M8589 Other specified disorders of bone density and structure, multiple sites: Secondary | ICD-10-CM | POA: Diagnosis not present

## 2022-02-01 DIAGNOSIS — R7301 Impaired fasting glucose: Secondary | ICD-10-CM | POA: Diagnosis not present

## 2022-02-01 DIAGNOSIS — E785 Hyperlipidemia, unspecified: Secondary | ICD-10-CM | POA: Diagnosis not present

## 2022-02-02 DIAGNOSIS — Z0000 Encounter for general adult medical examination without abnormal findings: Secondary | ICD-10-CM | POA: Diagnosis not present

## 2022-02-08 DIAGNOSIS — Z1389 Encounter for screening for other disorder: Secondary | ICD-10-CM | POA: Diagnosis not present

## 2022-02-08 DIAGNOSIS — Z780 Asymptomatic menopausal state: Secondary | ICD-10-CM | POA: Diagnosis not present

## 2022-02-08 DIAGNOSIS — D7589 Other specified diseases of blood and blood-forming organs: Secondary | ICD-10-CM | POA: Diagnosis not present

## 2022-02-08 DIAGNOSIS — M7660 Achilles tendinitis, unspecified leg: Secondary | ICD-10-CM | POA: Diagnosis not present

## 2022-02-08 DIAGNOSIS — Z1331 Encounter for screening for depression: Secondary | ICD-10-CM | POA: Diagnosis not present

## 2022-02-08 DIAGNOSIS — Z23 Encounter for immunization: Secondary | ICD-10-CM | POA: Diagnosis not present

## 2022-02-08 DIAGNOSIS — R030 Elevated blood-pressure reading, without diagnosis of hypertension: Secondary | ICD-10-CM | POA: Diagnosis not present

## 2022-02-08 DIAGNOSIS — E785 Hyperlipidemia, unspecified: Secondary | ICD-10-CM | POA: Diagnosis not present

## 2022-02-08 DIAGNOSIS — R82998 Other abnormal findings in urine: Secondary | ICD-10-CM | POA: Diagnosis not present

## 2022-02-08 DIAGNOSIS — R7301 Impaired fasting glucose: Secondary | ICD-10-CM | POA: Diagnosis not present

## 2022-02-08 DIAGNOSIS — Z0000 Encounter for general adult medical examination without abnormal findings: Secondary | ICD-10-CM | POA: Diagnosis not present

## 2022-02-08 DIAGNOSIS — M8580 Other specified disorders of bone density and structure, unspecified site: Secondary | ICD-10-CM | POA: Diagnosis not present

## 2022-02-09 ENCOUNTER — Ambulatory Visit (INDEPENDENT_AMBULATORY_CARE_PROVIDER_SITE_OTHER): Payer: PPO | Admitting: Sports Medicine

## 2022-02-09 DIAGNOSIS — M25562 Pain in left knee: Secondary | ICD-10-CM

## 2022-02-09 MED ORDER — METHYLPREDNISOLONE ACETATE 40 MG/ML IJ SUSP
40.0000 mg | Freq: Once | INTRAMUSCULAR | Status: AC
  Administered 2022-02-09: 40 mg via INTRA_ARTICULAR

## 2022-02-09 NOTE — Progress Notes (Signed)
   Subjective:    Patient ID: Sonya Luna, female    DOB: 1950-08-01, 71 y.o.   MRN: [id number]  HPI  Dhalia presents today for follow-up on left knee pain.  At the time of her last visit, it was not clear whether or not her pain was originating from the left knee or her proximal left calf.  Today she localizes her pain right along the medial joint line.  She did notice the meloxicam to be beneficial.  However, she does not want to chronically take NSAIDs.  We have discussed merits of a cortisone injection at her last visit if her pain persisted.  She would like to proceed with that today.    Review of Systems As above    Objective:   Physical Exam  Well-developed, well-nourished.  No acute distress  Left knee: Full range of motion.  No effusion.  She is tender to palpation along the medial joint line with positive Thessaly's.  No tenderness along the lateral joint line.  Knee remained stable ligamentous exam.  Neurovascularly intact distally.      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  This is accomplished atraumatically under sterile technique after risks and benefits were explained.  An anterior lateral approach was utilized.  She tolerates this without difficulty.  We will give her some isometric quad and hip strengthening exercises and she may resume activity as tolerated.  If her symptoms persist for another 3 weeks despite today's injection then I would start with getting an x-ray of the left knee.  Depending on those results we may need to proceed with MRI.  She will follow-up for ongoing or recalcitrant issues.  Consent obtained and verified. Time-out conducted. Noted no overlying erythema, induration, or other signs of local infection. Skin prepped in a sterile fashion. Topical analgesic spray: Ethyl chloride. Joint: Left knee Needle: 25-gauge 1.5 inch Completed  without difficulty. Meds: 3 cc 1% Xylocaine, 1 cc (40 mg) Depo-Medrol  Advised to call if fevers/chills, erythema, induration, drainage, or persistent bleeding.   This note was dictated using Dragon naturally speaking software and may contain errors in syntax, spelling, or content which have not been identified prior to signing this note.

## 2022-02-14 ENCOUNTER — Encounter: Payer: Self-pay | Admitting: Sports Medicine

## 2022-02-14 ENCOUNTER — Ambulatory Visit
Admission: RE | Admit: 2022-02-14 | Discharge: 2022-02-14 | Disposition: A | Discharge status: Home / Self Care | Payer: PPO | Source: Ambulatory Visit | Attending: Sports Medicine | Admitting: Sports Medicine

## 2022-02-14 DIAGNOSIS — M25562 Pain in left knee: Secondary | ICD-10-CM

## 2022-02-14 DIAGNOSIS — M1712 Unilateral primary osteoarthritis, left knee: Secondary | ICD-10-CM | POA: Diagnosis not present

## 2022-02-14 NOTE — Addendum Note (Signed)
Addended by: Cyd Silence on: 02/14/2022 10:11 AM   Modules accepted: Orders

## 2022-02-17 ENCOUNTER — Telehealth: Payer: Self-pay | Admitting: Sports Medicine

## 2022-02-17 NOTE — Telephone Encounter (Signed)
  I spoke with Sonya Luna on the phone today after reviewing x-rays of her knee.  We proceeded with x-rays after a recent cortisone injection provided her with no real pain relief.  The x-ray does show some mild to moderate arthritis in both the patellofemoral joint as well as the medial tibiofemoral joint.  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  She has a trip planned for September 9.  She had sent a MyChart message asking about possible viscosupplementation.  Before proceeding with that however, I recommended that we try a second cortisone injection.  I think this gives her the best chance for pain relief prior to her September 9 trip.  She will see me in the office the week prior to this for this injection.  Obviously, we will not continue to inject her with cortisone if it does not help.  At that point in time viscosupplementation would be very reasonable.  We could also consider an MRI but her clinical symptoms at this point in time do not suggest any sort of surgical pathology like a symptomatic meniscus tear.  Of note, she does find meloxicam to be beneficial but she has to take it for 5 straight days.  This note was dictated using Dragon naturally speaking software and may contain errors in syntax, spelling, or content which have not been identified prior to signing this note.

## 2022-02-28 ENCOUNTER — Ambulatory Visit (INDEPENDENT_AMBULATORY_CARE_PROVIDER_SITE_OTHER): Payer: PPO | Admitting: Sports Medicine

## 2022-02-28 VITALS — BP 132/70 | Ht 65.5 in | Wt 128.0 lb

## 2022-02-28 DIAGNOSIS — M1712 Unilateral primary osteoarthritis, left knee: Secondary | ICD-10-CM | POA: Diagnosis not present

## 2022-02-28 MED ORDER — METHYLPREDNISOLONE ACETATE 40 MG/ML IJ SUSP
40.0000 mg | Freq: Once | INTRAMUSCULAR | Status: AC
  Administered 2022-02-28: 40 mg via INTRA_ARTICULAR

## 2022-02-28 MED ORDER — MELOXICAM 15 MG PO TABS: ORAL_TABLET | ORAL | 1 refills | Status: DC

## 2022-02-28 NOTE — Addendum Note (Signed)
Addended by: Cyd Silence on: 02/28/2022 09:46 AM   Modules accepted: Orders

## 2022-02-28 NOTE — Progress Notes (Signed)
   Subjective:    Patient ID: Sonya Luna, female    DOB: 05-23-1951, 71 y.o.   MRN: [id number]  HPI  Sonya Luna presents today for persistent left knee pain.  A cortisone injection administered a few weeks ago provided her with no symptom relief.  Her pain is in the medial knee and is present only with walking.  She is able to do other forms of exercise including squatting and bending without pain.  No mechanical symptoms.  No swelling.  Meloxicam is helpful as is her compression sleeve.  She is leaving for a trip in a couple of weeks.  Recent x-rays showed moderate medial compartmental narrowing as well as a spur off of the superior patella.  Well-preserved joint space laterally.   Review of Systems As above    Objective:   Physical Exam  Well-developed, well-nourished.  No acute distress  Left knee: Full range of motion.  No effusion.  No significant tenderness to palpation along medial or lateral joint lines.  Negative McMurray's.  Mild pain with Thessaly's.  Neurovascularly intact distally.  Ambulating with a slight limp.  X-rays of the left knee are as above      Assessment & Plan:   Persistent left knee pain secondary to DJD versus degenerative meniscal tear  I recommended that we try a second cortisone injection, this time utilizing an anterior medial approach instead of an anterior lateral approach.  Patient agrees.  This is accomplished atraumatically under sterile technique.  I will also refill her meloxicam and she will continue to wear her compression sleeve when active.  She will follow-up with me again when she returns from her trip.  If symptoms persist despite today's second cortisone injection then we will likely order an MRI at that time.  We also discussed the possibility of viscosupplementation but we may want to get the MRI first.  Consent obtained and verified. Time-out conducted. Noted no overlying erythema, induration, or other signs of local infection. Skin  prepped in a sterile fashion. Topical analgesic spray: Ethyl chloride. Joint: Left knee, anterior medial approach Needle: 25-gauge 1.5 inch Completed without difficulty. Meds: 3 cc 1% Xylocaine, 1 cc (40 mg) Depo-Medrol  Advised to call if fevers/chills, erythema, induration, drainage, or persistent bleeding.   This note was dictated using Dragon naturally speaking software and may contain errors in syntax, spelling, or content which have not been identified prior to signing this note.

## 2022-03-28 ENCOUNTER — Ambulatory Visit (INDEPENDENT_AMBULATORY_CARE_PROVIDER_SITE_OTHER): Payer: PPO | Admitting: Sports Medicine

## 2022-03-28 VITALS — BP 102/62 | Ht 65.5 in

## 2022-03-28 DIAGNOSIS — M25562 Pain in left knee: Secondary | ICD-10-CM

## 2022-03-28 NOTE — Progress Notes (Signed)
   Subjective:    Patient ID: Sonya Luna, female    DOB: 05/23/1951, 71 y.o.   MRN: [id number]  HPI  Jadie presents today for follow-up on left knee pain.  Unfortunately, she did not experience any relief after her second cortisone injection.  The only thing that provides pain relief is meloxicam.  She denies locking catching or popping.  Previous x-rays and ultrasound showed some mild degenerative changes but nothing severe.   Review of Systems As above   Objective:   Physical Exam   Well-developed, well-nourished.  No acute distress  Left knee: Full range of motion.  No effusion.  Slight tenderness to palpation primarily over the medial femoral condyle with a positive Thessaly's.  Knee remained stable to the ligamentous exam.  Neurovascular intact distally.     Assessment & Plan:   Persistent left knee pain-rule out degenerative medial meniscal tear  MRI of the left knee specifically to evaluate for degenerative meniscal tear.  Phone follow-up with those results when available.  We will delineate further treatment based on those findings to include possible referral to orthopedics or a trial of viscosupplementation.  This note was dictated using Dragon naturally speaking software and may contain errors in syntax, spelling, or content which have not been identified prior to signing this note.

## 2022-04-04 ENCOUNTER — Ambulatory Visit
Admission: RE | Admit: 2022-04-04 | Discharge: 2022-04-04 | Disposition: A | Discharge status: Home / Self Care | Payer: PPO | Source: Ambulatory Visit | Attending: Sports Medicine | Admitting: Sports Medicine

## 2022-04-04 DIAGNOSIS — M25562 Pain in left knee: Secondary | ICD-10-CM

## 2022-04-04 DIAGNOSIS — M1712 Unilateral primary osteoarthritis, left knee: Secondary | ICD-10-CM | POA: Diagnosis not present

## 2022-04-04 DIAGNOSIS — M25462 Effusion, left knee: Secondary | ICD-10-CM | POA: Diagnosis not present

## 2022-04-04 DIAGNOSIS — S83222A Peripheral tear of medial meniscus, current injury, left knee, initial encounter: Secondary | ICD-10-CM | POA: Diagnosis not present

## 2022-04-04 DIAGNOSIS — R531 Weakness: Secondary | ICD-10-CM | POA: Diagnosis not present

## 2022-04-06 ENCOUNTER — Telehealth: Payer: Self-pay | Admitting: Sports Medicine

## 2022-04-06 NOTE — Telephone Encounter (Signed)
Dr [facility]  [address]  [place]  Monday 04/17/22 at 230p  [phone]

## 2022-04-06 NOTE — Telephone Encounter (Signed)
  I spoke with Sonya Luna on the phone today after reviewing MRI findings of her left knee.  Dominant finding is a large irregular radial tear of the posterior horn of the medial meniscus.  She only has a mild to moderate underlying degenerative changes.  Also seen is a osteochondral lesion of the medial femoral condyle which may suggest an underlying insufficiency fracture in this area.  Based on these findings I recommended surgical consultation with Dr. Rhona Raider to discuss possible arthroscopy.  I will defer further work-up and treatment to his discretion.  Patient will follow-up with me as needed.

## 2022-04-12 ENCOUNTER — Other Ambulatory Visit: Payer: PPO

## 2022-04-17 DIAGNOSIS — M25562 Pain in left knee: Secondary | ICD-10-CM | POA: Diagnosis not present

## 2022-04-19 ENCOUNTER — Encounter: Payer: Self-pay | Admitting: Sports Medicine

## 2022-05-17 DIAGNOSIS — M25562 Pain in left knee: Secondary | ICD-10-CM | POA: Diagnosis not present

## 2022-06-01 ENCOUNTER — Ambulatory Visit: Payer: PPO | Admitting: Sports Medicine

## 2022-06-01 VITALS — BP 132/84 | Ht 66.0 in | Wt 128.0 lb

## 2022-06-01 DIAGNOSIS — M7501 Adhesive capsulitis of right shoulder: Secondary | ICD-10-CM | POA: Diagnosis not present

## 2022-06-01 NOTE — Progress Notes (Signed)
  SUBJECTIVE:   CHIEF COMPLAINT / HPI:   Sonya Luna is a 71 yo presenting with 2 months of right shoulder pain. She is unsure of exact cause of pain but is concerned it is related to strength training class that she goes to. Pain is located to lateral part of shoulder and worsens with certain movements. She has pain with stirring food together for Thanksgiving. The pain is worst at night and she is not able to lay on that shoulder now. She has tried lidocaine patch, meloxicam, and aleve with minimal improvement in symptoms.   She previously had partial tear to rotator cuff in 2016 and 2020, but pain had completely resolved over time with that. She was not able to go to PT for 2020 injury due to [place].    PERTINENT  PMH / PSH: reviewed  Past Medical History:  Diagnosis Date   Bone spur of foot    right foot   Mallet finger    middle finger-right hand    OBJECTIVE:  LMP 07/03/2002  Constitutional: well-appearing  MSK:  Right shoulder- no swelling, no TTP to [facility] or [facility], decreased passive range of motion in external rotation of right shoulder, full ROM in Abduction bilaterally, muscle strength decreased in external rotation of right shoulder.  Neers positive Hawkins-Kennedy negative Cross over adduction of right arm with pain in lateral arm O'brien's negative Yergasons negative Speeds negative   ASSESSMENT/PLAN:  Right shoulder pain- 2/2 to adhesive capsulitis and likely rotator cuff strain. She has restricted motion in external rotation with weakness. Referral placed to PT to focus on range of motion. Will have her follow-up in 3 weeks and will do Ultrasound at that time.  Kacy Hegna M. Suvan Stcyr, D.O.  Internal Medicine Resident, PGY-2 [facility]  Pager: [phone]  Patient seen and evaluated with the resident.  I agree with the above plan of care.  Patient does have limited passive external rotation which is suspicious for adhesive capsulitis.  She  will start a home exercise program and a referral to physical therapy was placed.  I only want the focus to be on range of motion for now.  She will follow-up with me again in 3 weeks for an ultrasound of this right shoulder as I suspect she also has a tear of the infraspinatus based on her physical exam today.  We discussed treatment with topical nitroglycerin patches if that is the case.  She has used those in the past.  This note was dictated using Dragon naturally speaking software and may contain errors in syntax, spelling, or content which have not been identified prior to signing this note.

## 2022-06-06 NOTE — Therapy (Unsigned)
OUTPATIENT PHYSICAL THERAPY SHOULDER EVALUATION   Patient Name: Sonya Luna MRN: [id number] DOB:[birth year], 71 y.o., female Today's Date: 06/07/2022  END OF SESSION:  PT End of Session - 06/07/22 0857     Visit Number 1    Number of Visits 12    Date for PT Re-Evaluation 07/19/22    Authorization Type [employer]    PT Start Time 0850    PT Stop Time 0930    PT Time Calculation (min) 40 min    Activity Tolerance Patient tolerated treatment well    Behavior During Therapy WFL for tasks assessed/performed             Past Medical History:  Diagnosis Date   Bone spur of foot    right foot   Mallet finger    middle finger-right hand   Past Surgical History:  Procedure Laterality Date   FOOT SURGERY Right    bone spur removal   FOOT SURGERY Left    for bone spur   Patient Active Problem List   Diagnosis Date Noted   White coat syndrome with diagnosis of hypertension 05/15/2021   Postmenopausal 05/15/2021   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 Micheline Chapman, Carlos Levering   REFERRING DIAG:  Diagnosis  M75.01 (ICD-10-CM) - Adhesive capsulitis of right shoulder    THERAPY DIAG:  Chronic right shoulder pain  Muscle weakness (generalized)  Rationale for Evaluation and Treatment: Rehabilitation  ONSET DATE: 2 mos   SUBJECTIVE:                                                                                                                                                                                      SUBJECTIVE STATEMENT: Patient with acute on chronic Rt UE pain without significant injury.  She believes she may have done something in her group strength training class, but she is not sure. Pain is located to lateral part of shoulder and worsens with certain movements above shoulder height.  She had pain when usgin her arm to cook, stir, lift and reach overhead.  The pain is worse at night and she is not able to lay on that shoulder now. She has has some minimal improvement with aleve, ice/heat.  She will be having a MSK in a couple of weeks  and has not had an injection. She thinks she may aggravate it with overhead lifting and tries to modify when she works out.    PERTINENT HISTORY: Knee pain with meniscal tear, seeing Dr. Rhona Raider  She previously had partial tear to rotator cuff in 2016 and 2020, but pain had completely resolved over time with that. She was not able to go to PT for 2020 injury due to [place].   PAIN:  Are you having pain? Yes: NPRS scale: 4/10 Pain location: Rt superior anterior shoulder and into Rt biceps , deltoid  Pain description: achiness Aggravating factors: overhead, activity above shoulder  Relieving factors: lidocaine, heat/ice, massage  Pain can be 8/10 at the worst   PRECAUTIONS: None  WEIGHT BEARING RESTRICTIONS: No  FALLS:  Has patient fallen in last 6 months? No  LIVING ENVIRONMENT: Lives with: lives with their spouse Lives in: House/apartment Stairs: Yes: Internal: 12 steps; on right going up Has following equipment at home: None  OCCUPATION: Retired Press photographer   PLOF: Independent  PATIENT GOALS:Pt would like to not have pain anymore and get stronger.   NEXT MD VISIT:   OBJECTIVE:   DIAGNOSTIC FINDINGS:  Will have msk [place]   PATIENT SURVEYS:  FOTO 51%  COGNITION: Overall cognitive status: Within functional limits for tasks assessed     SENSATION: WFL  POSTURE: Very minimal rounding through upper back, Rt GH internally rotated.   UPPER EXTREMITY ROM:   Active ROM Right eval Left eval  Shoulder flexion 156 min pain,  pop 160  Shoulder extension    Shoulder abduction 152 , min pop 160  Shoulder adduction    Shoulder internal rotation WFL mid back  WFL mid back   Shoulder external rotation T2 T2  Elbow flexion    Elbow extension    Wrist flexion    Wrist extension    Wrist ulnar deviation    Wrist radial deviation    Wrist pronation    Wrist supination    (Blank rows = not tested)  UPPER EXTREMITY MMT:  MMT Right eval Left eval  Shoulder flexion 3/5 4+/5  Shoulder extension    Shoulder abduction 3+/5 4+/5  Shoulder adduction    Shoulder internal rotation 5/5 5/5   Shoulder external rotation 3/5 4/5  pop  Middle trapezius 4/5   Lower trapezius    Elbow flexion 4+/5 4+/5  Elbow extension    Wrist flexion    Wrist extension    Wrist ulnar deviation    Wrist radial deviation    Wrist pronation    Wrist supination    Grip strength (lbs) 22 34  (Blank rows = not tested)  SHOULDER SPECIAL TESTS: Impingement tests: Painful arc test: negative SLAP lesions:  NT Instability tests:  NT Rotator cuff assessment: Drop arm test: negative, Empty can test: positive , and Full can test: positive  Biceps assessment:  NT  JOINT MOBILITY TESTING:  Normal post capsule bilaterally   PALPATION:  TTP Rt anterior/lateral deltoid   TODAY'S TREATMENT:  DATE: 06/07/22   PATIENT EDUCATION: Education details: PT/POC, HEP and eval results , differential diagnosis  Person educated: Patient Education method: Explanation, Demonstration, and Handouts Education comprehension: verbalized understanding and returned demonstration  HOME EXERCISE PROGRAM: Access Code: [email or web address] URL: [email or web address] Date: 06/07/2022 Prepared by: Raeford Razor  Exercises - Seated Single Arm Shoulder External Rotation with Self-Anchored Resistance  - 1-2 x daily - 7 x weekly  - 2 sets - 10 reps - 5-10 hold - Supine Shoulder Flexion Extension AAROM with Dowel  - 1-2 x daily - 7 x weekly - 2 sets - 10 reps  ASSESSMENT:  CLINICAL IMPRESSION: Patient is a 71 y.o. female who was seen today for physical therapy evaluation and treatment for Rt shoulder pain . Exam revealed more of a rotator cuff tendinopathy with most of the limitation in Rt external rotation strength.   OBJECTIVE IMPAIRMENTS: decreased mobility, decreased ROM, decreased strength, increased fascial restrictions, impaired UE functional use, and pain.   ACTIVITY LIMITATIONS: lifting, sleeping, bathing, and reach over head  PARTICIPATION LIMITATIONS: community activity  PERSONAL FACTORS: Past/current experiences and 1 comorbidity: previous Rt UE pain, injury  are also affecting patient's functional outcome.   REHAB POTENTIAL: Excellent  CLINICAL DECISION MAKING: Stable/uncomplicated  EVALUATION COMPLEXITY: Low   GOALS: Goals reviewed with patient? Yes   LONG TERM GOALS: Target date: 07/19/2022    Pt will be able to sleep without limitation of pain in Rt UE Baseline:  Goal status: INITIAL  2.  Pt will be able to complete strength training at the gym (group class with modifications) and no increase in shoulder pain.  Baseline:  Goal status: INITIAL  3.  Pt will be report no pain at rest or with simple ADLs  Baseline:  Goal status: INITIAL  4.  Pt will be able to improve R ER strength 4+/5 or better  Baseline:  Goal status: INITIAL  5.  FOTO score  Baseline:  Goal status: INITIAL  6.  Pt will I with HEP for Rt UE strength and mobility Baseline:  Goal status: INITIAL  PLAN:  PT FREQUENCY: 2x/week  PT DURATION: 6 weeks  PLANNED INTERVENTIONS: Therapeutic exercises, Therapeutic activity, Neuromuscular re-education, Balance training, Gait training, Patient/Family education, Self Care, Joint mobilization, Dry Needling, Cryotherapy, Moist heat, Ionotophoresis '4mg'$ /ml Dexamethasone,  Manual therapy, and Re-evaluation  PLAN FOR NEXT SESSION: ionto, HEP, UBE    Libertie Hausler, PT 06/07/2022, 11:56 AM   Raeford Razor, PT 06/07/22 11:56 AM Phone: [phone] Fax: [phone]

## 2022-06-07 ENCOUNTER — Encounter: Payer: Self-pay | Admitting: Physical Therapy

## 2022-06-07 ENCOUNTER — Ambulatory Visit: Payer: PPO | Attending: Sports Medicine | Admitting: Physical Therapy

## 2022-06-07 DIAGNOSIS — M6281 Muscle weakness (generalized): Secondary | ICD-10-CM | POA: Insufficient documentation

## 2022-06-07 DIAGNOSIS — M7501 Adhesive capsulitis of right shoulder: Secondary | ICD-10-CM | POA: Insufficient documentation

## 2022-06-07 DIAGNOSIS — M25511 Pain in right shoulder: Secondary | ICD-10-CM | POA: Insufficient documentation

## 2022-06-07 DIAGNOSIS — G8929 Other chronic pain: Secondary | ICD-10-CM | POA: Diagnosis not present

## 2022-06-12 ENCOUNTER — Ambulatory Visit: Payer: PPO | Admitting: Physical Therapy

## 2022-06-12 ENCOUNTER — Encounter: Payer: Self-pay | Admitting: Physical Therapy

## 2022-06-12 DIAGNOSIS — M25511 Pain in right shoulder: Secondary | ICD-10-CM | POA: Diagnosis not present

## 2022-06-12 DIAGNOSIS — G8929 Other chronic pain: Secondary | ICD-10-CM

## 2022-06-12 DIAGNOSIS — M6281 Muscle weakness (generalized): Secondary | ICD-10-CM

## 2022-06-12 NOTE — Therapy (Signed)
OUTPATIENT PHYSICAL THERAPY TREATMENT NOTE   Patient Name: Sonya Luna MRN: [id number] DOB:[birth year], 71 y.o., female 69 Date: 06/12/2022  PCP: Crist Infante, D  REFERRING PROVIDER: Dr. Micheline Chapman   END OF SESSION:   PT End of Session - 06/12/22 0809     Visit Number 2    Number of Visits 12    Date for PT Re-Evaluation 07/19/22    Authorization Type [employer]    PT Start Time 0805    PT Stop Time 0850    PT Time Calculation (min) 45 min    Activity Tolerance Patient tolerated treatment well    Behavior During Therapy WFL for tasks assessed/performed             Past Medical History:  Diagnosis Date   Bone spur of foot    right foot   Mallet finger    middle finger-right hand   Past Surgical History:  Procedure Laterality Date   FOOT SURGERY Right    bone spur removal   FOOT SURGERY Left    for bone spur   Patient Active Problem List   Diagnosis Date Noted   White coat syndrome with diagnosis of hypertension 05/15/2021   Postmenopausal 05/15/2021   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 08/11/2015   Gastrocnemius muscle strain 06/28/2015   Bicipital tendonitis of right shoulder 05/12/2014   Partial tear of right rotator cuff 05/12/2014   TRIGGER FINGER, LEFT THUMB 07/15/2010   ROTATOR CUFF SYNDROME, RIGHT 12/08/2009   WRIST PAIN, RIGHT 06/23/2009   DE QUERVAIN'S TENOSYNOVITIS 06/23/2009    REFERRING DIAG:  Diagnosis  M75.01 (ICD-10-CM) - Adhesive capsulitis of right shoulder    THERAPY DIAG:  Chronic right shoulder pain  Muscle weakness (generalized)  Rationale for Evaluation and Treatment Rehabilitation  PERTINENT HISTORY: see above   PRECAUTIONS: none   SUBJECTIVE:                                                                                                                                                                                      SUBJECTIVE STATEMENT:  I feel encouraged that I am more comfortable at night,  not as restless.  The L one hurts too since she was here last time.     PAIN:  Are you having pain? Yes: NPRS scale: 5/10 Pain location: Rt UE>Lt.   Pain description: sharp, twinge  Aggravating factors: reaching out and back (external rotation, donning a coat) Relieving factors: rest    OBJECTIVE: (objective measures completed at initial evaluation unless otherwise dated)  DIAGNOSTIC FINDINGS:  Will have msk [place]    PATIENT SURVEYS:  FOTO 51%   COGNITION:  Overall cognitive status: Within functional limits for tasks assessed                                  SENSATION: WFL   POSTURE: Very minimal rounding through upper back, Rt GH internally rotated.    UPPER EXTREMITY ROM:    Active ROM Right eval Left eval  Shoulder flexion 156 min pain, pop 160  Shoulder extension      Shoulder abduction 152 , min pop 160  Shoulder adduction      Shoulder internal rotation WFL mid back  WFL mid back   Shoulder external rotation T2 T2  Elbow flexion      Elbow extension      Wrist flexion      Wrist extension      Wrist ulnar deviation      Wrist radial deviation      Wrist pronation      Wrist supination      (Blank rows = not tested)   UPPER EXTREMITY MMT:   MMT Right eval Left eval  Shoulder flexion 3/5 4+/5  Shoulder extension      Shoulder abduction 3+/5 4+/5  Shoulder adduction      Shoulder internal rotation 5/5 5/5   Shoulder external rotation 3/5 4/5  pop  Middle trapezius 4/5    Lower trapezius      Elbow flexion 4+/5 4+/5  Elbow extension      Wrist flexion      Wrist extension      Wrist ulnar deviation      Wrist radial deviation      Wrist pronation      Wrist supination      Grip strength (lbs) 22 34  (Blank rows = not tested)   SHOULDER SPECIAL TESTS: Impingement tests: Painful arc test: negative SLAP lesions:  NT Instability tests:  NT Rotator cuff assessment: Drop arm test: negative, Empty can test: positive , and Full can test: positive   Biceps assessment:  NT   JOINT MOBILITY TESTING:  Normal post capsule bilaterally    PALPATION:  TTP Rt anterior/lateral deltoid             TODAY'S TREATMENT:                                                                                                                                         DATE:   [facility] Adult PT Treatment:                                                DATE: 06/12/22 Therapeutic Exercise: UBE level 1 , 5 min , (2 min each direction) Supine dowel flexion bilateral  2 x 10 palms up, palms down  Supine external rotation dowel (no pain)  Red banded strengthening exercises: narrow grip overhead, ER and horizontal abd x 15 each (modified to yellow band)  Chest press 2 lbs x 15 Sidelying ER no wgt x 10, unable to do 1 # Abduction 1# x 15  Reverse fly 1 # x 10 manual cues for scapular position and control Standing row green band x 20 Front facing red band flexion, side facing overhead flexion yellow x 15 each  Modalities: Cold pack 10 min Rt shoulder     PATIENT EDUCATION: Education details: PT/POC, HEP progression, control  Person educated: Patient Education method: Explanation, Demonstration, and Handouts Education comprehension: verbalized understanding and returned demonstration   HOME EXERCISE PROGRAM: Access Code: [email or web address] URL: [email or web address] Date: 06/07/2022 Prepared by: Raeford Razor    Access Code: [email or web address] URL: [email or web address] Date: 06/12/2022 Prepared by: Raeford Razor  Exercises - Seated Single Arm Shoulder External Rotation with Self-Anchored Resistance  - 1-2 x daily - 7 x weekly - 2 sets - 10 reps - 5-10 hold - Shoulder Internal Rotation with Resistance  - 1 x daily - 7 x weekly - 2 sets - 10 reps - 30 hold - Supine Shoulder Flexion Extension AAROM with Dowel  - 1-2 x daily - 7 x weekly - 2 sets - 10 reps - Supine Shoulder Horizontal Abduction with Resistance  - 1 x daily - 7 x weekly - 2 sets - 10  reps - 5 hold - Single Arm Chest Press with Resistance  - 1 x daily - 7 x weekly - 2 sets - 10 reps - 5 hold - Standing Bilateral Low Shoulder Row with Anchored Resistance  - 1 x daily - 7 x weekly - 2 sets - 10 reps - 5 hold    ASSESSMENT:   CLINICAL IMPRESSION: Patient continues to have Rt shoulder pain and discomfort in the lowering phase of flexion when standing and supine, pain with any resisted External rotation (ER).  ER flexibility is passively [facility].  Rt UE fatigued with exercises.  She wil perform her exercises on both UEs and wants to have as many exercises as she can to improve her recovery. Cold pack post session to reduce inflammation.    OBJECTIVE IMPAIRMENTS: decreased mobility, decreased ROM, decreased strength, increased fascial restrictions, impaired UE functional use, and pain.    ACTIVITY LIMITATIONS: lifting, sleeping, bathing, and reach over head   PARTICIPATION LIMITATIONS: community activity   PERSONAL FACTORS: Past/current experiences and 1 comorbidity: previous Rt UE pain, injury  are also affecting patient's functional outcome.    REHAB POTENTIAL: Excellent   CLINICAL DECISION MAKING: Stable/uncomplicated   EVALUATION COMPLEXITY: Low     GOALS: Goals reviewed with patient? Yes     LONG TERM GOALS: Target date: 07/19/2022     Pt will be able to sleep without limitation of pain in Rt UE Baseline:  Goal status:ongoing , improving already    2.  Pt will be able to complete strength training at the gym (group class with modifications) and no increase in shoulder pain.  Baseline:  Goal status: INITIAL   3.  Pt will be report no pain at rest or with simple ADLs  Baseline:  Goal status: INITIAL   4.  Pt will be able to improve R ER strength 4+/5 or better  Baseline:  Goal status: INITIAL   5.  FOTO score  Baseline:  Goal status: INITIAL   6.  Pt will  I with HEP for Rt UE strength and mobility Baseline:  Goal status: INITIAL   PLAN:   PT  FREQUENCY: 2x/week   PT DURATION: 6 weeks   PLANNED INTERVENTIONS: Therapeutic exercises, Therapeutic activity, Neuromuscular re-education, Balance training, Gait training, Patient/Family education, Self Care, Joint mobilization, Dry Needling, Cryotherapy, Moist heat, Ionotophoresis '4mg'$ /ml Dexamethasone, Manual therapy, and Re-evaluation   PLAN FOR NEXT SESSION: ionto, HEP, UBE      Sonya Luna, PT 06/07/2022, 11:56 AM    Raeford Razor, PT 06/07/22 11:56 AM Phone: [phone] Fax: [phone]      Koree Schopf, PT 06/12/2022, 9:12 AM     Raeford Razor, PT 06/12/22 9:12 AM Phone: [phone] Fax: [phone]

## 2022-06-14 ENCOUNTER — Encounter: Payer: Self-pay | Admitting: Physical Therapy

## 2022-06-14 ENCOUNTER — Ambulatory Visit: Payer: PPO | Admitting: Physical Therapy

## 2022-06-14 DIAGNOSIS — M25511 Pain in right shoulder: Secondary | ICD-10-CM | POA: Diagnosis not present

## 2022-06-14 DIAGNOSIS — M6281 Muscle weakness (generalized): Secondary | ICD-10-CM

## 2022-06-14 DIAGNOSIS — G8929 Other chronic pain: Secondary | ICD-10-CM

## 2022-06-14 DIAGNOSIS — M25562 Pain in left knee: Secondary | ICD-10-CM | POA: Diagnosis not present

## 2022-06-14 NOTE — Therapy (Signed)
OUTPATIENT PHYSICAL THERAPY TREATMENT NOTE   Patient Name: Sonya Luna MRN: [id number] DOB:April 18, 1951, 71 y.o., female 34 Date: 06/14/2022  PCP: Crist Infante, D  REFERRING PROVIDER: Dr. Micheline Chapman   END OF SESSION:   PT End of Session - 06/14/22 1601     Visit Number 3    Number of Visits 12    Date for PT Re-Evaluation 07/19/22    Authorization Type [employer]    PT Start Time 1552    PT Stop Time 1632    PT Time Calculation (min) 40 min    Activity Tolerance Patient tolerated treatment well    Behavior During Therapy WFL for tasks assessed/performed              Past Medical History:  Diagnosis Date   Bone spur of foot    right foot   Mallet finger    middle finger-right hand   Past Surgical History:  Procedure Laterality Date   FOOT SURGERY Right    bone spur removal   FOOT SURGERY Left    for bone spur   Patient Active Problem List   Diagnosis Date Noted   White coat syndrome with diagnosis of hypertension 05/15/2021   Postmenopausal 05/15/2021   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 08/11/2015   Gastrocnemius muscle strain 06/28/2015   Bicipital tendonitis of right shoulder 05/12/2014   Partial tear of right rotator cuff 05/12/2014   TRIGGER FINGER, LEFT THUMB 07/15/2010   ROTATOR CUFF SYNDROME, RIGHT 12/08/2009   WRIST PAIN, RIGHT 06/23/2009   DE QUERVAIN'S TENOSYNOVITIS 06/23/2009    REFERRING DIAG:  Diagnosis  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  No pain right now.  Patient did  have some on the UBE    PAIN:  Are you having pain? Yes: NPRS scale: 0/10 Pain location: Rt UE>Lt.   Pain description: sharp, twinge  Aggravating factors: reaching out and back (external rotation, donning a coat) Relieving factors: rest    OBJECTIVE: (objective measures completed at initial evaluation unless otherwise dated)  DIAGNOSTIC FINDINGS:  Will have msk [place]    PATIENT SURVEYS:  FOTO 51%   COGNITION: Overall cognitive status: Within functional limits for tasks assessed  SENSATION: WFL   POSTURE: Very minimal rounding through upper back, Rt GH internally rotated.    UPPER EXTREMITY ROM:    Active ROM Right eval Left eval  Shoulder flexion 156 min pain, pop 160  Shoulder extension      Shoulder abduction 152 , min pop 160  Shoulder adduction      Shoulder internal rotation WFL mid back  WFL mid back   Shoulder external rotation T2 T2  Elbow flexion      Elbow extension      Wrist flexion      Wrist extension      Wrist ulnar deviation      Wrist radial deviation      Wrist pronation      Wrist supination      (Blank rows = not tested)   UPPER EXTREMITY MMT:   MMT Right eval Left eval  Shoulder flexion 3/5 4+/5  Shoulder extension      Shoulder abduction 3+/5 4+/5  Shoulder adduction      Shoulder internal rotation 5/5 5/5   Shoulder external rotation 3/5 4/5  pop  Middle trapezius 4/5    Lower trapezius      Elbow flexion 4+/5 4+/5  Elbow extension      Wrist flexion      Wrist extension      Wrist ulnar deviation      Wrist radial deviation      Wrist pronation      Wrist supination      Grip strength (lbs) 22 34  (Blank rows = not tested)   SHOULDER SPECIAL TESTS: Impingement tests: Painful arc test: negative SLAP lesions:  NT Instability tests:  NT Rotator cuff assessment: Drop arm test: negative, Empty can test: positive , and Full can test: positive  Biceps assessment:  NT   JOINT MOBILITY  TESTING:  Normal post capsule bilaterally    PALPATION:  TTP Rt anterior/lateral deltoid             TODAY'S TREATMENT:                                                                                                                                         DATE:    [facility] Adult PT Treatment:                                                DATE: 06/14/22 Therapeutic Exercise: UBE 5 min level 1  Foam roller for core: Alt arms flex/ext Open/close Scap protraction/retraction  Pilates circle overhead lift  Chest press with ER Sidelying scaption yellow x 15 Flexion too painful, change to standing YTB x 15 elbow straight (pain) and then punch red x 15  Manual Therapy:  Palpation to Rt  biceps muscle, tendon to provoke symptoms , prone teres minor and infraspinatus  Brief soft tissue work to post rotator cuff, light GHJ distraction and PROM to Rt UE in abd, ER without pain .    [facility] Adult PT Treatment:                                                DATE: 06/12/22 Therapeutic Exercise: UBE level 1 , 5 min , (2 min each direction) Supine dowel flexion bilateral 2 x 10 palms up, palms down  Supine external rotation dowel (no pain)  Red banded strengthening exercises: narrow grip overhead, ER and horizontal abd x 15 each (modified to yellow band)  Chest press 2 lbs x 15 Sidelying ER no wgt x 10, unable to do 1 # Abduction 1# x 15  Reverse fly 1 # x 10 manual cues for scapular position and control Standing row green band x 20 Front facing red band flexion, side facing overhead flexion yellow x 15 each  Modalities: Cold pack 10 min Rt shoulder     PATIENT EDUCATION: Education details: PT/POC, HEP progression, control  Person educated: Patient Education method: Explanation, Demonstration, and Handouts Education comprehension: verbalized understanding and returned demonstration   HOME EXERCISE PROGRAM: Access Code: [email or web address] URL: [email or web address] Date:  06/07/2022 Prepared by: Raeford Razor    Access Code: [email or web address] URL: [email or web address] Date: 06/12/2022 Prepared by: Raeford Razor  Exercises - Seated Single Arm Shoulder External Rotation with Self-Anchored Resistance  - 1-2 x daily - 7 x weekly - 2 sets - 10 reps - 5-10 hold - Shoulder Internal Rotation with Resistance  - 1 x daily - 7 x weekly - 2 sets - 10 reps - 30 hold - Supine Shoulder Flexion Extension AAROM with Dowel  - 1-2 x daily - 7 x weekly - 2 sets - 10 reps - Supine Shoulder Horizontal Abduction with Resistance  - 1 x daily - 7 x weekly - 2 sets - 10 reps - 5 hold - Single Arm Chest Press with Resistance  - 1 x daily - 7 x weekly - 2 sets - 10 reps - 5 hold - Standing Bilateral Low Shoulder Row with Anchored Resistance  - 1 x daily - 7 x weekly - 2 sets - 10 reps - 5 hold    ASSESSMENT:   CLINICAL IMPRESSION: Symptoms continue to improve.  Pain in Rt UE with resisted shoulder external rotation.  Modified exercises for pain.  She is looking forward to her [place] next week.  Biceps is not painful but 4+/5 compared to LUE. Cont POC as tolerated.    OBJECTIVE IMPAIRMENTS: decreased mobility, decreased ROM, decreased strength, increased fascial restrictions, impaired UE functional use, and pain.    ACTIVITY LIMITATIONS: lifting, sleeping, bathing, and reach over head   PARTICIPATION LIMITATIONS: community activity   PERSONAL FACTORS: Past/current experiences and 1 comorbidity: previous Rt UE pain, injury  are also affecting patient's functional outcome.    REHAB POTENTIAL: Excellent   CLINICAL DECISION MAKING: Stable/uncomplicated   EVALUATION COMPLEXITY: Low     GOALS: Goals reviewed with patient? Yes     LONG TERM GOALS: Target date: 07/19/2022     Pt will be able to sleep without limitation of pain in Rt UE Baseline:  Goal status:ongoing , improving already    2.  Pt will be able to complete  strength training at the gym (group class with  modifications) and no increase in shoulder pain.  Baseline:  Goal status: INITIAL   3.  Pt will be report no pain at rest or with simple ADLs  Baseline:  Goal status: INITIAL   4.  Pt will be able to improve R ER strength 4+/5 or better  Baseline:  Goal status: INITIAL   5.  FOTO score  Baseline:  Goal status: INITIAL   6.  Pt will I with HEP for Rt UE strength and mobility Baseline:  Goal status: INITIAL   PLAN:   PT FREQUENCY: 2x/week   PT DURATION: 6 weeks   PLANNED INTERVENTIONS: Therapeutic exercises, Therapeutic activity, Neuromuscular re-education, Balance training, Gait training, Patient/Family education, Self Care, Joint mobilization, Dry Needling, Cryotherapy, Moist heat, Ionotophoresis '4mg'$ /ml Dexamethasone, Manual therapy, and Re-evaluation   PLAN FOR NEXT SESSION: ionto, HEP, UBE      Sonya Luna, PT 06/07/2022, 11:56 AM    Raeford Razor, PT 06/07/22 11:56 AM Phone: [phone] Fax: [phone]      Sonya Luna, PT 06/14/2022, 4:41 PM     Raeford Razor, PT 06/14/22 4:41 PM Phone: [phone] Fax: [phone]

## 2022-06-18 NOTE — Therapy (Unsigned)
OUTPATIENT PHYSICAL THERAPY TREATMENT NOTE   Patient Name: Sonya Luna MRN: [id number] DOB:09-10-1950, 71 y.o., female 41 Date: 06/19/2022  PCP: Crist Infante, D  REFERRING PROVIDER: Dr. Micheline Chapman   END OF SESSION:   PT End of Session - 06/19/22 0851     Visit Number 4    Number of Visits 12    Date for PT Re-Evaluation 07/19/22    Authorization Type [employer]    PT Start Time 0850    PT Stop Time 0930    PT Time Calculation (min) 40 min    Activity Tolerance Patient tolerated treatment well    Behavior During Therapy WFL for tasks assessed/performed               Past Medical History:  Diagnosis Date   Bone spur of foot    right foot   Mallet finger    middle finger-right hand   Past Surgical History:  Procedure Laterality Date   FOOT SURGERY Right    bone spur removal   FOOT SURGERY Left    for bone spur   Patient Active Problem List   Diagnosis Date Noted   White coat syndrome with diagnosis of hypertension 05/15/2021   Postmenopausal 05/15/2021   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 08/11/2015   Gastrocnemius muscle strain 06/28/2015   Bicipital tendonitis of right shoulder 05/12/2014   Partial tear of right rotator cuff 05/12/2014   TRIGGER FINGER, LEFT THUMB 07/15/2010   ROTATOR CUFF SYNDROME, RIGHT 12/08/2009   WRIST PAIN, RIGHT 06/23/2009   DE QUERVAIN'S TENOSYNOVITIS 06/23/2009    REFERRING DIAG:  Diagnosis  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  The right one wakes her up from sleep.  I feel like I am doing external rotation 1 little bit better5 PAIN:  Are you having pain? Yes: NPRS scale: 0/10 Pain location: Rt UE>Lt.   Pain description: sharp, twinge  Aggravating factors: reaching out and back (external rotation, donning a coat) Relieving factors: rest    OBJECTIVE: (objective measures completed at initial evaluation unless otherwise dated)  DIAGNOSTIC FINDINGS:  Will have msk [place]    PATIENT SURVEYS:  FOTO 51%  COGNITION: Overall cognitive status: Within functional limits for tasks assessed                                  SENSATION: WFL   POSTURE: Very minimal rounding through upper back, Rt GH internally rotated.    UPPER EXTREMITY ROM:    Active ROM Right eval Left eval  Shoulder flexion 156 min pain, pop 160  Shoulder extension      Shoulder abduction 152 , min pop 160  Shoulder adduction      Shoulder internal rotation WFL mid back  WFL mid back   Shoulder external rotation T2 T2  Elbow flexion      Elbow extension      Wrist flexion      Wrist extension      Wrist ulnar deviation      Wrist radial deviation      Wrist pronation      Wrist supination      (Blank rows = not tested)   UPPER EXTREMITY MMT:   MMT Right eval Left eval  Shoulder flexion 3/5 4+/5  Shoulder extension      Shoulder abduction 3+/5 4+/5  Shoulder adduction      Shoulder internal rotation 5/5 5/5   Shoulder external rotation 3/5 4/5  pop  Middle trapezius 4/5    Lower trapezius      Elbow flexion 4+/5 4+/5  Elbow extension      Wrist flexion      Wrist extension      Wrist ulnar deviation      Wrist radial deviation      Wrist pronation      Wrist supination      Grip strength (lbs) 22 34  (Blank rows = not tested)   SHOULDER SPECIAL TESTS: Impingement tests: Painful arc test: negative SLAP lesions:  NT Instability tests:  NT Rotator cuff assessment: Drop arm test: negative, Empty can test:  positive , and Full can test: positive  Biceps assessment:  NT   JOINT MOBILITY TESTING:  Normal post capsule bilaterally    PALPATION:  TTP Rt anterior/lateral deltoid             TODAY'S TREATMENT:                                                                                                                                         DATE:   [facility] Adult PT Treatment:                                                DATE: 06/19/22 Therapeutic Exercise: UBE level 2, 3 min FW and 3 min back  Standing scapular squeeze  ball on wall Red looped band : overhead lift, small pulse ER Dumbbell 2 lbs Forward flexion elbows bent, goal post  Semi-circle red looped band 45 sec each arm  Wall push up with RTB  x15  Corner stretch 20 sec x 5  Sidelying ER no wgt. (Unable to do  Manual Therapy: Supine passive range of motion right upper extremity all planes to tolerance Side-lying scapular mobilization Soft tissue to posterior cuff, subscapularis/teres minor latissimus dorsi , anterior lateral deltoid  [facility] Adult PT Treatment:                                                DATE: 06/14/22 Therapeutic Exercise: UBE 5 min level 1  Foam roller for core: Alt arms flex/ext Open/close Scap protraction/retraction  Pilates circle overhead lift  Chest press with ER Sidelying scaption yellow x 15 Flexion too painful, change to standing YTB x 15 elbow straight (pain) and then punch red x 15  Manual Therapy:  Palpation to Rt biceps muscle, tendon to provoke symptoms , prone teres minor and infraspinatus  Brief soft tissue work to post rotator cuff, light GHJ distraction and PROM to Rt UE in abd, ER without pain .    [facility] Adult PT Treatment:                                                DATE: 06/12/22 Therapeutic Exercise: UBE level 1 , 5 min , (2 min each direction) Supine dowel flexion bilateral 2 x 10 palms up, palms down  Supine external rotation dowel (no pain)  Red banded strengthening  exercises: narrow grip overhead, ER and horizontal abd x 15 each (modified to yellow band)  Chest press 2 lbs x 15 Sidelying ER no wgt x 10, unable to do 1 # Abduction 1# x 15  Reverse fly 1 # x 10 manual cues for scapular position and control Standing row green band x 20 Front facing red band flexion, side facing overhead flexion yellow x 15 each  Modalities: Cold pack 10 min Rt shoulder     PATIENT EDUCATION: Education details: PT/POC, HEP progression, control  Person educated: Patient Education method: Explanation, Demonstration, and Handouts Education comprehension: verbalized understanding and returned demonstration   HOME EXERCISE PROGRAM: Access Code: [email or web address] URL: [email or web address] Date: 06/07/2022 Prepared by: Raeford Razor    Access Code: [email or web address] URL: [email or web address] Date: 06/12/2022 Prepared by: Raeford Razor  Exercises - Seated Single Arm Shoulder External Rotation with Self-Anchored Resistance  - 1-2 x daily - 7 x weekly - 2 sets - 10 reps - 5-10 hold - Shoulder Internal Rotation with Resistance  - 1 x daily - 7 x weekly - 2 sets - 10 reps - 30 hold - Supine Shoulder Flexion Extension AAROM with Dowel  - 1-2 x daily - 7 x weekly - 2 sets - 10 reps - Supine Shoulder Horizontal Abduction with Resistance  - 1 x daily - 7 x weekly - 2 sets - 10 reps - 5 hold - Single Arm Chest Press with Resistance  - 1 x daily - 7 x weekly - 2 sets - 10 reps - 5 hold - Standing Bilateral Low Shoulder Row with Anchored Resistance  - 1 x  daily - 7 x weekly - 2 sets - 10 reps - 5 hold    ASSESSMENT:   CLINICAL IMPRESSION: Patient is noticing small improvements in discomfort at night but continues to have pain with resisted external rotation and right upper extremity.  She is looking forward to information obtained from the ultrasound tomorrow.  She continues to perform her HEP on a regular basis.  Educated today on potential dry needling for  posterior cuff once she gets the ultrasound completed. Cont POC.     OBJECTIVE IMPAIRMENTS: decreased mobility, decreased ROM, decreased strength, increased fascial restrictions, impaired UE functional use, and pain.    ACTIVITY LIMITATIONS: lifting, sleeping, bathing, and reach over head   PARTICIPATION LIMITATIONS: community activity   PERSONAL FACTORS: Past/current experiences and 1 comorbidity: previous Rt UE pain, injury  are also affecting patient's functional outcome.    REHAB POTENTIAL: Excellent   CLINICAL DECISION MAKING: Stable/uncomplicated   EVALUATION COMPLEXITY: Low     GOALS: Goals reviewed with patient? Yes     LONG TERM GOALS: Target date: 07/19/2022     Pt will be able to sleep without limitation of pain in Rt UE Baseline:  Goal status:ongoing , improving already    2.  Pt will be able to complete strength training at the gym (group class with modifications) and no increase in shoulder pain.  Baseline:  Goal status: INITIAL   3.  Pt will be report no pain at rest or with simple ADLs  Baseline:  Goal status: INITIAL   4.  Pt will be able to improve R ER strength 4+/5 or better  Baseline:  Goal status: INITIAL   5.  FOTO score  Baseline:  Goal status: INITIAL   6.  Pt will I with HEP for Rt UE strength and mobility Baseline:  Goal status: INITIAL   PLAN:   PT FREQUENCY: 2x/week   PT DURATION: 6 weeks   PLANNED INTERVENTIONS: Therapeutic exercises, Therapeutic activity, Neuromuscular re-education, Balance training, Gait training, Patient/Family education, Self Care, Joint mobilization, Dry Needling, Cryotherapy, Moist heat, Ionotophoresis '4mg'$ /ml Dexamethasone, Manual therapy, and Re-evaluation   PLAN FOR NEXT SESSION: ionto?  HEP, UBE . [place] results?           Jahara Dail, PT 06/19/2022, 11:53 AM     Raeford Razor, PT 06/19/22 11:53 AM Phone: [phone] Fax: [phone]

## 2022-06-19 ENCOUNTER — Encounter: Payer: Self-pay | Admitting: Physical Therapy

## 2022-06-19 ENCOUNTER — Ambulatory Visit: Payer: PPO | Admitting: Physical Therapy

## 2022-06-19 DIAGNOSIS — M25511 Pain in right shoulder: Secondary | ICD-10-CM | POA: Diagnosis not present

## 2022-06-19 DIAGNOSIS — M6281 Muscle weakness (generalized): Secondary | ICD-10-CM

## 2022-06-19 DIAGNOSIS — G8929 Other chronic pain: Secondary | ICD-10-CM

## 2022-06-20 ENCOUNTER — Ambulatory Visit: Payer: Self-pay

## 2022-06-20 ENCOUNTER — Ambulatory Visit: Payer: PPO | Admitting: Sports Medicine

## 2022-06-20 VITALS — BP 122/82 | Ht 66.0 in | Wt 128.0 lb

## 2022-06-20 DIAGNOSIS — M7501 Adhesive capsulitis of right shoulder: Secondary | ICD-10-CM | POA: Diagnosis not present

## 2022-06-20 DIAGNOSIS — M75101 Unspecified rotator cuff tear or rupture of right shoulder, not specified as traumatic: Secondary | ICD-10-CM

## 2022-06-21 ENCOUNTER — Encounter: Payer: Self-pay | Admitting: Physical Therapy

## 2022-06-21 ENCOUNTER — Ambulatory Visit: Payer: PPO | Admitting: Physical Therapy

## 2022-06-21 DIAGNOSIS — M6281 Muscle weakness (generalized): Secondary | ICD-10-CM

## 2022-06-21 DIAGNOSIS — M25511 Pain in right shoulder: Secondary | ICD-10-CM | POA: Diagnosis not present

## 2022-06-21 DIAGNOSIS — G8929 Other chronic pain: Secondary | ICD-10-CM

## 2022-06-21 NOTE — Therapy (Addendum)
OUTPATIENT PHYSICAL THERAPY TREATMENT NOTE   Patient Name: Sonya Luna MRN: [id number] DOB:May 18, 1951, 71 y.o., female 78 Date: 06/21/2022  PCP: Crist Infante, D  REFERRING PROVIDER: Dr. Micheline Chapman   END OF SESSION:   PT End of Session - 06/21/22 1329     Visit Number 5    Number of Visits 12    Date for PT Re-Evaluation 07/19/22    Authorization Type [employer]    PT Start Time 1315    PT Stop Time 2979    PT Time Calculation (min) 43 min               Past Medical History:  Diagnosis Date   Bone spur of foot    right foot   Mallet finger    middle finger-right hand   Past Surgical History:  Procedure Laterality Date   FOOT SURGERY Right    bone spur removal   FOOT SURGERY Left    for bone spur   Patient Active Problem List   Diagnosis Date Noted   White coat syndrome with diagnosis of hypertension 05/15/2021   Postmenopausal 05/15/2021   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 08/11/2015   Gastrocnemius muscle strain 06/28/2015   Bicipital tendonitis of right shoulder 05/12/2014   Partial tear of right rotator cuff 05/12/2014   TRIGGER FINGER, LEFT THUMB 07/15/2010   ROTATOR CUFF SYNDROME, RIGHT 12/08/2009   WRIST PAIN, RIGHT 06/23/2009   DE QUERVAIN'S TENOSYNOVITIS 06/23/2009    REFERRING DIAG:  Diagnosis  M75.01 (ICD-10-CM) - Adhesive capsulitis of right shoulder    THERAPY DIAG:  Chronic right shoulder pain  Muscle weakness (generalized)  Rationale for Evaluation and Treatment Rehabilitation  PERTINENT HISTORY: see above   PRECAUTIONS: none   SUBJECTIVE:                                                                                                                                                                                      SUBJECTIVE STATEMENT:   MD found minor tears in rotator cuff but nothing that needs surgery. I want to try dry needling.   PAIN:  Are you having pain? Yes: NPRS scale: 0/10 Pain  location: Rt UE>Lt.   Pain description: sharp, twinge  Aggravating factors: reaching out and back (external rotation, donning a coat) Relieving factors: rest    OBJECTIVE: (objective measures completed at initial evaluation unless otherwise dated)  DIAGNOSTIC FINDINGS:  Will have msk [place]    PATIENT SURVEYS:  FOTO 51%   COGNITION: Overall cognitive status: Within functional limits for tasks assessed  SENSATION: WFL   POSTURE: Very minimal rounding through upper back, Rt GH internally rotated.    UPPER EXTREMITY ROM:    Active ROM Right eval Left eval  Shoulder flexion 156 min pain, pop 160  Shoulder extension      Shoulder abduction 152 , min pop 160  Shoulder adduction      Shoulder internal rotation WFL mid back  WFL mid back   Shoulder external rotation T2 T2  Elbow flexion      Elbow extension      Wrist flexion      Wrist extension      Wrist ulnar deviation      Wrist radial deviation      Wrist pronation      Wrist supination      (Blank rows = not tested)   UPPER EXTREMITY MMT:   MMT Right eval Left eval  Shoulder flexion 3/5 4+/5  Shoulder extension      Shoulder abduction 3+/5 4+/5  Shoulder adduction      Shoulder internal rotation 5/5 5/5   Shoulder external rotation 3/5 4/5  pop  Middle trapezius 4/5    Lower trapezius      Elbow flexion 4+/5 4+/5  Elbow extension      Wrist flexion      Wrist extension      Wrist ulnar deviation      Wrist radial deviation      Wrist pronation      Wrist supination      Grip strength (lbs) 22 34  (Blank rows = not tested)   SHOULDER SPECIAL TESTS: Impingement tests: Painful arc test: negative SLAP lesions:  NT Instability tests:  NT Rotator cuff assessment: Drop arm test: negative, Empty can test: positive , and Full can test: positive  Biceps assessment:  NT   JOINT MOBILITY TESTING:  Normal post capsule bilaterally    PALPATION:  TTP Rt anterior/lateral  deltoid             TODAY'S TREATMENT:                                                                                                                                         DATE:   [facility] Adult PT Treatment:                                                DATE: 06/21/22 Therapeutic Exercise: UBE  L2  5 minutes after TPDN Standing Red band ER and IR - pain with ER reduced to yellow. Needs tactile cues to avoid compensation  S/L ER AROM 10 x 2  Isometrics 5 sec  x 10  Manual Therapy: Passive stretching shoulder IR and ER   Trigger Point Dry-Needling performed by  Hilda Blades, DPT Treatment instructions: Expect mild to moderate muscle soreness. S/S of pneumothorax if dry needled over a lung field, and to seek immediate medical attention should they occur. Patient verbalized understanding of these instructions and education.  Patient Consent Given: Yes Education handout provided: Yes Muscles treated: Infraspinatus and middle deltoid Electrical stimulation performed: No Parameters: N/A Treatment response/outcome: twitch response   [facility] Adult PT Treatment:                                                DATE: 06/19/22 Therapeutic Exercise: UBE level 2, 3 min FW and 3 min back  Standing scapular squeeze ball on wall Red looped band : overhead lift, small pulse ER Dumbbell 2 lbs Forward flexion elbows bent, goal post  Semi-circle red looped band 45 sec each arm  Wall push up with RTB  x15  Corner stretch 20 sec x 5  Sidelying ER no wgt. (Unable to do  Manual Therapy: Supine passive range of motion right upper extremity all planes to tolerance Side-lying scapular mobilization Soft tissue to posterior cuff, subscapularis/teres minor latissimus dorsi , anterior lateral deltoid  [facility] Adult PT Treatment:                                                DATE: 06/14/22 Therapeutic Exercise: UBE 5 min level 1  Foam roller for core: Alt arms flex/ext Open/close Scap protraction/retraction   Pilates circle overhead lift  Chest press with ER Sidelying scaption yellow x 15 Flexion too painful, change to standing YTB x 15 elbow straight (pain) and then punch red x 15  Manual Therapy:  Palpation to Rt biceps muscle, tendon to provoke symptoms , prone teres minor and infraspinatus  Brief soft tissue work to post rotator cuff, light GHJ distraction and PROM to Rt UE in abd, ER without pain .    [facility] Adult PT Treatment:                                                DATE: 06/12/22 Therapeutic Exercise: UBE level 1 , 5 min , (2 min each direction) Supine dowel flexion bilateral 2 x 10 palms up, palms down  Supine external rotation dowel (no pain)  Red banded strengthening exercises: narrow grip overhead, ER and horizontal abd x 15 each (modified to yellow band)  Chest press 2 lbs x 15 Sidelying ER no wgt x 10, unable to do 1 # Abduction 1# x 15  Reverse fly 1 # x 10 manual cues for scapular position and control Standing row green band x 20 Front facing red band flexion, side facing overhead flexion yellow x 15 each  Modalities: Cold pack 10 min Rt shoulder     PATIENT EDUCATION: Education details: PT/POC, HEP progression, control  Person educated: Patient Education method: Explanation, Demonstration, and Handouts Education comprehension: verbalized understanding and returned demonstration   HOME EXERCISE PROGRAM: Access Code: [email or web address] URL: [email or web address] Date: 06/07/2022 Prepared by: Raeford Razor    Access Code: [email or web address] URL: [email or web address] Date: 06/12/2022 Prepared by: Raeford Razor  Exercises - Seated Single Arm  Shoulder External Rotation with Self-Anchored Resistance  - 1-2 x daily - 7 x weekly - 2 sets - 10 reps - 5-10 hold - Shoulder Internal Rotation with Resistance  - 1 x daily - 7 x weekly - 2 sets - 10 reps - 30 hold - Supine Shoulder Flexion Extension AAROM with Dowel  - 1-2 x daily - 7 x weekly - 2 sets - 10  reps - Supine Shoulder Horizontal Abduction with Resistance  - 1 x daily - 7 x weekly - 2 sets - 10 reps - 5 hold - Single Arm Chest Press with Resistance  - 1 x daily - 7 x weekly - 2 sets - 10 reps - 5 hold - Standing Bilateral Low Shoulder Row with Anchored Resistance  - 1 x daily - 7 x weekly - 2 sets - 10 reps - 5 hold    ASSESSMENT:   CLINICAL IMPRESSION: Patient reports MD saw small tears on [place] but might do an MRI if she does not improve. She is interested in TPDN today. Hilda Blades DPT performed TPDN to infraspinatus and middle deltoid on right shoulder. Continued with infraspinatus stretching and activation. No immediate change in pain symptoms post needling. She was given after care instructions. Will assess response next visit and consider future TPDN.     OBJECTIVE IMPAIRMENTS: decreased mobility, decreased ROM, decreased strength, increased fascial restrictions, impaired UE functional use, and pain.    ACTIVITY LIMITATIONS: lifting, sleeping, bathing, and reach over head   PARTICIPATION LIMITATIONS: community activity   PERSONAL FACTORS: Past/current experiences and 1 comorbidity: previous Rt UE pain, injury  are also affecting patient's functional outcome.    REHAB POTENTIAL: Excellent   CLINICAL DECISION MAKING: Stable/uncomplicated   EVALUATION COMPLEXITY: Low     GOALS: Goals reviewed with patient? Yes     LONG TERM GOALS: Target date: 07/19/2022     Pt will be able to sleep without limitation of pain in Rt UE Baseline:  Goal status:ongoing , improving already    2.  Pt will be able to complete strength training at the gym (group class with modifications) and no increase in shoulder pain.  Baseline:  Goal status: INITIAL   3.  Pt will be report no pain at rest or with simple ADLs  Baseline:  Goal status: INITIAL   4.  Pt will be able to improve R ER strength 4+/5 or better  Baseline:  Goal status: INITIAL   5.  FOTO score  Baseline:  Goal  status: INITIAL   6.  Pt will I with HEP for Rt UE strength and mobility Baseline:  Goal status: INITIAL   PLAN:   PT FREQUENCY: 2x/week   PT DURATION: 6 weeks   PLANNED INTERVENTIONS: Therapeutic exercises, Therapeutic activity, Neuromuscular re-education, Balance training, Gait training, Patient/Family education, Self Care, Joint mobilization, Dry Needling, Cryotherapy, Moist heat, Ionotophoresis '4mg'$ /ml Dexamethasone, Manual therapy, and Re-evaluation   PLAN FOR NEXT SESSION: ionto?  HEP, UBE . [place] results? Assess response to TPDN of infraspinatus and deltoid          Hessie Diener, PTA 06/21/22 2:03 PM Phone: [phone] Fax: [phone]      Raeford Razor, PT 06/21/22 2:03 PM Phone: [phone] Fax: [phone]

## 2022-06-21 NOTE — Progress Notes (Signed)
   Subjective:    Patient ID: Sonya Luna, female    DOB: October 09, 1950, 71 y.o.   MRN: [id number]  HPI  Sonya Luna returns today for a complete right shoulder ultrasound.  She has started physical therapy for mild adhesive capsulitis.  The nighttime pain she was experiencing previously has improved although not resolved.   Review of Systems As above    Objective:   Physical Exam  Well-developed, well-nourished.  No acute distress  Right shoulder: There is still some limited passive external rotation but otherwise her range of motion is actually pretty good.  She has good strength with resisted supraspinatus and subscapularis but still with weakness and pain with resisted external rotation on the right.  MSK ultrasound of the right shoulder:  Biceps tendon was well-visualized in the bicipital groove in both long and short axis.  There may be a split tear in the proximal biceps tendon as it lies in the groove but the majority of the tendon appears to be intact.  AC joint shows some degenerative changes as well as a mushroom sign.  Subscapularis appears to be intact.  The supraspinatus insertion into the humeral head is poorly visualized and likely represents a tear and a retracted tendon here.  Infraspinatus shows some hypoechoic changes in the deep fibers at the insertion of the humeral head but no retraction.  Glenohumeral joint is well-visualized without abnormality. Findings are concerning for a torn and retracted supraspinatus tendon.      Assessment & Plan:   Right shoulder pain secondary to rotator cuff tear and mild adhesive capsulitis  We are going to continue with conservative treatment for now.  Treat will follow-up with physical therapy as scheduled but I have asked that they go ahead and start treating for her rotator cuff tear as well as her adhesive capsulitis.  She will return to the office in 3 to 4 weeks for reevaluation.  We may need to consider an MRI down the road to  evaluate further.  Of note, Sonya Luna has decided to go ahead with knee arthroscopy with Dr. Rhona Raider sometime in the new year for her torn meniscus.  This note was dictated using Dragon naturally speaking software and may contain errors in syntax, spelling, or content which have not been identified prior to signing this note.

## 2022-06-21 NOTE — Patient Instructions (Signed)

## 2022-06-27 ENCOUNTER — Ambulatory Visit: Payer: PPO | Admitting: Physical Therapy

## 2022-06-27 ENCOUNTER — Encounter: Payer: Self-pay | Admitting: Physical Therapy

## 2022-06-27 DIAGNOSIS — M6281 Muscle weakness (generalized): Secondary | ICD-10-CM

## 2022-06-27 DIAGNOSIS — G8929 Other chronic pain: Secondary | ICD-10-CM

## 2022-06-27 DIAGNOSIS — M25511 Pain in right shoulder: Secondary | ICD-10-CM | POA: Diagnosis not present

## 2022-06-27 NOTE — Patient Instructions (Signed)

## 2022-06-27 NOTE — Therapy (Signed)
OUTPATIENT PHYSICAL THERAPY TREATMENT NOTE   Patient Name: Sonya Luna MRN: [id number] DOB:June 06, 1951, 71 y.o., female 22 Date: 06/27/2022  PCP: Crist Infante, D  REFERRING PROVIDER: Dr. Micheline Chapman   END OF SESSION:   PT End of Session - 06/27/22 0855     Visit Number 6    Number of Visits 12    Date for PT Re-Evaluation 07/19/22    Authorization Type [employer]    PT Start Time 0845    PT Stop Time 0930    PT Time Calculation (min) 45 min    Behavior During Therapy WFL for tasks assessed/performed                Past Medical History:  Diagnosis Date   Bone spur of foot    right foot   Mallet finger    middle finger-right hand   Past Surgical History:  Procedure Laterality Date   FOOT SURGERY Right    bone spur removal   FOOT SURGERY Left    for bone spur   Patient Active Problem List   Diagnosis Date Noted   White coat syndrome with diagnosis of hypertension 05/15/2021   Postmenopausal 05/15/2021   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 08/11/2015   Gastrocnemius muscle strain 06/28/2015   Bicipital tendonitis of right shoulder 05/12/2014   Partial tear of right rotator cuff 05/12/2014   TRIGGER FINGER, LEFT THUMB 07/15/2010   ROTATOR CUFF SYNDROME, RIGHT 12/08/2009   WRIST PAIN, RIGHT 06/23/2009   DE QUERVAIN'S TENOSYNOVITIS 06/23/2009    REFERRING DIAG:  Diagnosis  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 I did some isometrics against the wall, generally sore.  The  needling helped for about a day and 1/2 /  The pain is never below 5/10 at any time.   PAIN:  Are you having pain? Yes: NPRS scale: 5/10 Pain location: Rt UE>Lt.   Pain description: sharp, twinge  Aggravating factors: reaching out and back (external rotation, donning a coat) Relieving factors: rest    OBJECTIVE: (objective measures completed at initial evaluation unless otherwise dated)  DIAGNOSTIC FINDINGS:  06/20/22: Biceps tendon was well-visualized in the bicipital  groove in both long and short axis.  There may be a split tear in the proximal biceps tendon as it lies in the groove but the majority of the tendon appears to be intact.  AC joint shows some degenerative changes as well as a mushroom sign.  Subscapularis appears to be intact.  The supraspinatus insertion into the humeral head is poorly visualized and likely represents a tear and a retracted tendon here.  Infraspinatus shows some hypoechoic changes in the deep fibers at the insertion of the humeral head but no retraction.  Glenohumeral joint is well-visualized without abnormality. Findings are concerning for a torn and retracted supraspinatus tendon.       PATIENT SURVEYS:  FOTO 51%   COGNITION: Overall cognitive status: Within functional limits for tasks assessed                                  SENSATION: WFL   POSTURE: Very minimal rounding through upper back, Rt GH internally rotated.    UPPER EXTREMITY ROM:    Active ROM Right eval Left eval  Shoulder flexion 156 min pain, pop 160  Shoulder extension      Shoulder abduction 152 , min pop 160  Shoulder adduction      Shoulder internal rotation WFL mid back  WFL mid back   Shoulder external rotation T2 T2  Elbow flexion      Elbow extension      Wrist flexion      Wrist extension      Wrist ulnar deviation      Wrist radial deviation      Wrist pronation      Wrist supination      (Blank rows = not tested)   UPPER EXTREMITY MMT:   MMT Right eval  Left eval  Shoulder flexion 3/5 4+/5  Shoulder extension      Shoulder abduction 3+/5 4+/5  Shoulder adduction      Shoulder internal rotation 5/5 5/5   Shoulder external rotation 3/5 4/5  pop  Middle trapezius 4/5    Lower trapezius      Elbow flexion 4+/5 4+/5  Elbow extension      Wrist flexion      Wrist extension      Wrist ulnar deviation      Wrist radial deviation      Wrist pronation      Wrist supination      Grip strength (lbs) 22 34  (Blank rows = not tested)   SHOULDER SPECIAL TESTS: Impingement tests: Painful arc test: negative SLAP lesions:  NT Instability tests:  NT Rotator cuff assessment: Drop arm test: negative, Empty can test: positive , and Full can test: positive  Biceps assessment:  NT   JOINT MOBILITY TESTING:  Normal post capsule bilaterally    PALPATION:  TTP Rt anterior/lateral deltoid             TODAY'S TREATMENT:  [facility]                                                DATE: 06/27/22 Therapeutic Exercise: UBE level 1 , 6 min, 3 min each direction  Seated isometric R adduction with soft ball Red TB for shoulder strength: horizontal pull, abduction, flexion to 90 deg all x 10-15 reps External rotation into soft ball into wall x 15  UE Ranger flexion overhead, abduction /adduction, scaption Sidelying reverse fly 2 lbs x 15 Sidelying abduction 1 lbs x 15  Sidelying ER x 10 no wgt Manual Therapy: Rt PROM all planes  Post capsule stretch   Modalities: Ionto patch Rt supraspinatus 6 hour patch 1 cc dexamethasone Precautions, what to look for, expectations    [facility] Adult PT Treatment:                                                DATE: 06/21/22 Therapeutic Exercise: UBE  L2  5 minutes after TPDN Standing Red band ER and IR - pain with ER reduced to yellow. Needs tactile cues to  avoid compensation  S/L ER AROM 10 x 2  Isometrics 5 sec  x 10  Manual Therapy: Passive stretching shoulder IR and ER   Trigger Point Dry-Needling performed by Hilda Blades, DPT Treatment instructions: Expect mild to moderate muscle soreness. S/S of pneumothorax if dry needled over a lung field, and to seek immediate medical attention should they occur. Patient verbalized understanding of these instructions and education.  Patient Consent Given: Yes Education handout provided: Yes Muscles treated: Infraspinatus and middle deltoid Electrical stimulation performed: No Parameters: N/A Treatment response/outcome: twitch response   [facility] Adult PT Treatment:                                                DATE: 06/19/22 Therapeutic Exercise: UBE level 2, 3 min FW and 3 min back  Standing scapular squeeze ball on wall Red looped band : overhead lift, small pulse ER Dumbbell 2 lbs Forward flexion elbows bent, goal post  Semi-circle red looped band 45 sec each arm  Wall push up with RTB  x15  Corner stretch 20 sec x 5  Sidelying ER no wgt. (Unable to do  Manual Therapy: Supine passive range of motion right upper extremity all planes to tolerance Side-lying scapular mobilization Soft tissue to posterior cuff, subscapularis/teres minor latissimus dorsi , anterior lateral deltoid  [facility] Adult PT Treatment:                                                DATE: 06/14/22 Therapeutic Exercise: UBE 5 min level 1  Foam roller for core: Alt arms flex/ext Open/close Scap protraction/retraction  Pilates circle overhead lift  Chest press with ER Sidelying scaption yellow x 15 Flexion too painful, change to standing YTB x 15 elbow straight (pain) and then punch red x 15  Manual  Therapy:  Palpation to Rt biceps muscle, tendon to provoke symptoms , prone teres minor and infraspinatus  Brief soft tissue work to post rotator cuff, light GHJ distraction and PROM to Rt UE in abd, ER without pain .     [facility] Adult PT Treatment:                                                DATE: 06/12/22 Therapeutic Exercise: UBE level 1 , 5 min , (2 min each direction) Supine dowel flexion bilateral 2 x 10 palms up, palms down  Supine external rotation dowel (no pain)  Red banded strengthening exercises: narrow grip overhead, ER and horizontal abd x 15 each (modified to yellow band)  Chest press 2 lbs x 15 Sidelying ER no wgt x 10, unable to do 1 # Abduction 1# x 15  Reverse fly 1 # x 10 manual cues for scapular position and control Standing row green band x 20 Front facing red band flexion, side facing overhead flexion yellow x 15 each  Modalities: Cold pack 10 min Rt shoulder     PATIENT EDUCATION: Education details:ionto , POC  Person educated: Patient Education method: Explanation, Demonstration, and Handouts Education comprehension: verbalized understanding and returned demonstration   HOME EXERCISE PROGRAM: Access Code: [email or web address] URL: [email or web address] Date: 06/07/2022 Prepared by: Raeford Razor    Access Code: [email or web address] URL: [email or web address] Date: 06/12/2022 Prepared by: Raeford Razor  Exercises - Seated Single Arm Shoulder External Rotation with Self-Anchored Resistance  - 1-2 x daily - 7 x weekly - 2 sets - 10 reps - 5-10 hold - Shoulder Internal Rotation with Resistance  - 1 x daily - 7 x weekly - 2 sets - 10 reps - 30 hold - Supine Shoulder Flexion Extension AAROM with Dowel  - 1-2 x daily - 7 x weekly - 2 sets - 10 reps - Supine Shoulder Horizontal Abduction with Resistance  - 1 x daily - 7 x weekly - 2 sets - 10 reps - 5 hold - Single Arm Chest Press with Resistance  - 1 x daily - 7 x weekly - 2 sets - 10 reps - 5 hold - Standing Bilateral Low Shoulder Row with Anchored Resistance  - 1 x daily - 7 x weekly - 2 sets - 10 reps - 5 hold    ASSESSMENT:   CLINICAL IMPRESSION: Patient with small bruise in scapular region.  She did have a  good effect from her TrP DN treatment.  She was able to improve her sidelying Rt shoulder strength/ROM today in comparison with last week. Applied ionto patch to Rt shoulder. Patient will schedule for another month of PT in order to gain as much function as she can in this shoulder.  She certainly does not want surgery.  Encouraged her to be patient as it could take several months of consistent work to make that happen.     OBJECTIVE IMPAIRMENTS: decreased mobility, decreased ROM, decreased strength, increased fascial restrictions, impaired UE functional use, and pain.    ACTIVITY LIMITATIONS: lifting, sleeping, bathing, and reach over head   PARTICIPATION LIMITATIONS: community activity   PERSONAL FACTORS: Past/current experiences and 1 comorbidity: previous Rt UE pain, injury  are also affecting patient's functional outcome.    REHAB POTENTIAL: Excellent   CLINICAL DECISION MAKING: Stable/uncomplicated   EVALUATION COMPLEXITY: Low  GOALS: Goals reviewed with patient? Yes     LONG TERM GOALS: Target date: 07/19/2022     Pt will be able to sleep without limitation of pain in Rt UE Baseline:  Goal status:ongoing , improving already    2.  Pt will be able to complete strength training at the gym (group class with modifications) and no increase in shoulder pain.  Baseline:  Goal status: ongoing    3.  Pt will be report no pain at rest or with simple ADLs  Baseline: usually 5/10  Goal status: ongoing    4.  Pt will be able to improve R ER strength 4+/5 or better  Baseline:  Goal status: ongoing    5.  FOTO score will improve to 68% or greater as a proxy for improved Rt shoulder function.  Baseline: 55% Goal status: ongoing    6.  Pt will I with HEP for Rt UE strength and mobility Baseline:  Goal status: ongoing    PLAN:   PT FREQUENCY: 2x/week   PT DURATION: 6 weeks   PLANNED INTERVENTIONS: Therapeutic exercises, Therapeutic activity, Neuromuscular re-education,  Balance training, Gait training, Patient/Family education, Self Care, Joint mobilization, Dry Needling, Cryotherapy, Moist heat, Ionotophoresis '4mg'$ /ml Dexamethasone, Manual therapy, and Re-evaluation   PLAN FOR NEXT SESSION: did ionto have an effect? Cont to progress HEP, UBE . [place] results? Add in TPDN as indicated.             Raeford Razor, PT 06/27/22 9:43 AM Phone: [phone] Fax: [phone]

## 2022-07-03 HISTORY — PX: KNEE ARTHROSCOPY: SHX127

## 2022-07-04 ENCOUNTER — Other Ambulatory Visit: Payer: Self-pay

## 2022-07-04 ENCOUNTER — Ambulatory Visit: Payer: PPO | Attending: Sports Medicine | Admitting: Physical Therapy

## 2022-07-04 ENCOUNTER — Encounter: Payer: Self-pay | Admitting: Physical Therapy

## 2022-07-04 DIAGNOSIS — G8929 Other chronic pain: Secondary | ICD-10-CM | POA: Diagnosis not present

## 2022-07-04 DIAGNOSIS — M6281 Muscle weakness (generalized): Secondary | ICD-10-CM | POA: Diagnosis not present

## 2022-07-04 DIAGNOSIS — M25511 Pain in right shoulder: Secondary | ICD-10-CM | POA: Insufficient documentation

## 2022-07-04 NOTE — Patient Instructions (Signed)
Access Code: [email or web address] URL: [email or web address] Date: 07/04/2022 Prepared by: Hilda Blades  Exercises - Seated Single Arm Shoulder External Rotation with Self-Anchored Resistance  - 1-2 x daily - 7 x weekly - 2 sets - 10 reps - 5-10 hold - Shoulder Internal Rotation with Resistance  - 1 x daily - 7 x weekly - 2 sets - 10 reps - 30 hold - Supine Shoulder Flexion Extension AAROM with Dowel  - 1-2 x daily - 7 x weekly - 2 sets - 10 reps - Supine Shoulder Horizontal Abduction with Resistance  - 1 x daily - 7 x weekly - 2 sets - 10 reps - 5 hold - Single Arm Chest Press with Resistance  - 1 x daily - 7 x weekly - 2 sets - 10 reps - 5 hold - Standing Bilateral Low Shoulder Row with Anchored Resistance  - 1 x daily - 7 x weekly - 2 sets - 10 reps - 5 hold - Prone Shoulder Extension - Single Arm  - 1 x daily - 2 sets - 10 reps - Prone Single Arm Shoulder Horizontal Abduction with Scapular Retraction and Palm Down  - 1 x daily - 2 sets - 10 reps - Prone Single Arm Shoulder Y  - 1 x daily - 2 sets - 10 reps - Prone Shoulder External Rotation  - 1 x daily - 2 sets - 10 reps

## 2022-07-04 NOTE — Therapy (Signed)
OUTPATIENT PHYSICAL THERAPY TREATMENT NOTE   Patient Name: Sonya Luna MRN: [id number] DOB:December 24, 1950, 72 y.o., female Today's Date: 07/04/2022  PCP: Crist Infante, D  REFERRING PROVIDER: Dr. Micheline Chapman    END OF SESSION:   PT End of Session - 07/04/22 1401     Visit Number 7    Number of Visits 12    Date for PT Re-Evaluation 07/19/22    Authorization Type [employer]    PT Start Time 1400    PT Stop Time 1445    PT Time Calculation (min) 45 min    Activity Tolerance Patient tolerated treatment well    Behavior During Therapy WFL for tasks assessed/performed             Past Medical History:  Diagnosis Date   Bone spur of foot    right foot   Mallet finger    middle finger-right hand   Past Surgical History:  Procedure Laterality Date   FOOT SURGERY Right    bone spur removal   FOOT SURGERY Left    for bone spur   Patient Active Problem List   Diagnosis Date Noted   White coat syndrome with diagnosis of hypertension 05/15/2021   Postmenopausal 05/15/2021   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 08/11/2015   Gastrocnemius muscle strain 06/28/2015   Bicipital tendonitis of right shoulder 05/12/2014   Partial tear of right rotator cuff 05/12/2014   TRIGGER FINGER, LEFT THUMB 07/15/2010   ROTATOR CUFF SYNDROME, RIGHT 12/08/2009   WRIST PAIN, RIGHT 06/23/2009   DE QUERVAIN'S TENOSYNOVITIS 06/23/2009    REFERRING DIAG:  Diagnosis  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 The other exercises feel like  stretches. She tried to do them twice a day and that may be why she is having a little more pain.    PAIN:  Are you having pain? Yes:  NPRS scale: 5-6/10 Pain location: Right shoulder   Pain description: sharp, twinge  Aggravating factors: reaching out and back (external rotation, donning a coat) Relieving factors: rest      OBJECTIVE: (objective measures completed at initial evaluation unless otherwise dated) DIAGNOSTIC FINDINGS:  06/20/22: Biceps tendon was well-visualized in the bicipital groove in both long and short axis.  There may be a split tear in the proximal biceps tendon as it lies in the groove but the majority of the tendon appears to be intact.  AC joint shows some degenerative changes as well as a mushroom sign.  Subscapularis appears to be intact.  The supraspinatus insertion into the humeral head is poorly visualized and likely represents a tear and a retracted tendon here.  Infraspinatus shows some hypoechoic changes in the deep fibers at the insertion of the humeral head but no retraction.  Glenohumeral joint is well-visualized without abnormality. Findings are concerning for a torn and retracted supraspinatus tendon.    PATIENT SURVEYS:  FOTO 51% 07/04/2021: 57%  POSTURE: Very minimal rounding through upper back, Rt GH internally rotated.    UPPER EXTREMITY ROM:    Active ROM Right eval Left eval  Shoulder flexion 156 min pain, pop 160  Shoulder extension      Shoulder abduction 152 , min pop 160  Shoulder adduction      Shoulder internal rotation WFL mid back  WFL mid back   Shoulder external rotation T2 T2  Elbow flexion      Elbow extension      Wrist flexion      Wrist extension      Wrist ulnar deviation      Wrist radial deviation      Wrist pronation      Wrist supination      (Blank rows = not tested)   UPPER EXTREMITY MMT:   MMT Right eval Left eval Right 07/04/2021  Shoulder flexion 3/5 4+/5   Shoulder extension       Shoulder abduction  3+/5 4+/5   Shoulder adduction       Shoulder internal rotation 5/5 5/5    Shoulder external rotation 3/5 4/5  pop 3+/5  Middle trapezius 4/5     Lower trapezius       Elbow flexion 4+/5 4+/5   Elbow extension       Wrist flexion       Wrist extension       Wrist ulnar deviation       Wrist radial deviation       Wrist pronation       Wrist supination       Grip strength (lbs) 22 34   (Blank rows = not tested)   SHOULDER SPECIAL TESTS: Impingement tests: Painful arc test: negative SLAP lesions:  NT Instability tests:  NT Rotator cuff assessment: Drop arm test: negative, Empty can test: positive , and Full can test: positive  Biceps assessment:  NT   JOINT MOBILITY TESTING:  Normal post capsule bilaterally    PALPATION:  TTP Rt anterior/lateral deltoid              TODAY'S TREATMENT: [facility] Adult PT Treatment:                                                DATE: 06/27/22 Therapeutic Exercise: UBE L1 x 4 min (2 fwd/bwd) while taking subjective Sidelying cross body stretch 2 x 30 sec Supine serratus press 2 x 15 Supine shoulder flexion with yellow loop at wrist 2 x 10 Sidelying ER with 1# 2 x 10 Prone I, T, Y x 10 each Prone ER at 90 deg abduction x 10 Manual Therapy: Skilled palpation and monitoring of muscle tension while performing TPDN treatment STM right posterior cuff region Trigger Point Dry Needling Treatment: Pre-treatment instruction: Patient instructed on dry needling rationale, procedures, and possible side effects including pain during treatment (achy,cramping feeling), bruising, drop of blood, lightheadedness, nausea, sweating. Patient Consent Given: Yes Education handout provided: No Muscles treated: right infraspinatus  Needle size and number: .30x5m x 5 Electrical stimulation performed: No Parameters: N/A Treatment response/outcome: Twitch response elicited and Palpable decrease in muscle tension Post-treatment instructions: Patient instructed to  expect possible mild to  moderate muscle soreness later today and/or tomorrow. Patient instructed in methods to reduce muscle soreness and to continue prescribed HEP. If patient was dry needled over the lung field, patient was instructed on signs and symptoms of pneumothorax and, however unlikely, to see immediate medical attention should they occur. Patient was also educated on signs and symptoms of infection and to seek medical attention should they occur. Patient verbalized understanding of these instructions and education.   [facility] Adult PT Treatment:                                                DATE: 06/27/22 Therapeutic Exercise: UBE level 1 , 6 min, 3 min each direction  Seated isometric R adduction with soft ball Red TB for shoulder strength: horizontal pull, abduction, flexion to 90 deg all x 10-15 reps External rotation into soft ball into wall x 15  UE Ranger flexion overhead, abduction /adduction, scaption Sidelying reverse fly 2 lbs x 15 Sidelying abduction 1 lbs x 15  Sidelying ER x 10 no wgt Manual Therapy: Rt PROM all planes  Post capsule stretch  Modalities: Ionto patch Rt supraspinatus 6 hour patch 1 cc dexamethasone Precautions, what to look for, expectations    [facility] Adult PT Treatment:                                                DATE: 06/21/22 Therapeutic Exercise: UBE  L2  5 minutes after TPDN Standing Red band ER and IR - pain with ER reduced to yellow. Needs tactile cues to avoid compensation  S/L ER AROM 10 x 2  Isometrics 5 sec  x 10  Manual Therapy: Passive stretching shoulder IR and ER  Trigger Point Dry-Needling performed by Hilda Blades, DPT Treatment instructions: Expect mild to moderate muscle soreness. S/S of pneumothorax if dry needled over a lung field, and to seek immediate medical attention should they occur. Patient verbalized understanding of these instructions and education.   Patient Consent Given: Yes Education handout provided:  Yes Muscles treated: Infraspinatus and middle deltoid Electrical stimulation performed: No Parameters: N/A Treatment response/outcome: twitch response     PATIENT EDUCATION: Education details: HEP update, TPDN Person educated: Patient Education method: Explanation, Demonstration, and Handouts Education comprehension: verbalized understanding and returned demonstration   HOME EXERCISE PROGRAM: Access Code: [email or web address]     ASSESSMENT: CLINICAL IMPRESSION: Patient tolerated therapy well with no adverse effects. Performed TPDN for infraspinatus with good therapeutic benefit. She does report concordant lateral/anterior shoulder pain with palpation of infraspinatus, but does seem to have some LH biceps tendon tenderness. Therapy continued to focus primarily on progressing rotator cuff strengthening. She does exhibit weakness with shoulder external rotation. Patient did report an improvement in her functional status on FOTO this visit. Updated her HEP this visit to progress rotator cuff and parascapular musculature. Patient would benefit from continued skilled PT to progress her shoulder mobility and strength in order to reduce pain and maximize functional ability.     OBJECTIVE IMPAIRMENTS: decreased mobility, decreased ROM, decreased strength, increased fascial restrictions, impaired UE functional use, and pain.    ACTIVITY LIMITATIONS: lifting, sleeping, bathing, and reach over head   PARTICIPATION LIMITATIONS: community activity   PERSONAL FACTORS: Past/current experiences  and 1 comorbidity: previous Rt UE pain, injury  are also affecting patient's functional outcome.      GOALS: Goals reviewed with patient? Yes   LONG TERM GOALS: Target date: 07/19/2022   Pt will be able to sleep without limitation of pain in Rt UE Baseline:  Goal status:ongoing , improving already    2.  Pt will be able to complete strength training at the gym (group class with modifications) and no increase in  shoulder pain.  Baseline:  Goal status: ongoing    3.  Pt will be report no pain at rest or with simple ADLs  Baseline: usually 5/10  Goal status: ongoing    4.  Pt will be able to improve R ER strength 4+/5 or better  Baseline:  Goal status: ongoing    5.  FOTO score will improve to 68% or greater as a proxy for improved Rt shoulder function.  Baseline: 55% Goal status: ongoing    6.  Pt will I with HEP for Rt UE strength and mobility Baseline:  Goal status: ongoing     PLAN: PT FREQUENCY: 2x/week   PT DURATION: 6 weeks   PLANNED INTERVENTIONS: Therapeutic exercises, Therapeutic activity, Neuromuscular re-education, Balance training, Gait training, Patient/Family education, Self Care, Joint mobilization, Dry Needling, Cryotherapy, Moist heat, Ionotophoresis '4mg'$ /ml Dexamethasone, Manual therapy, and Re-evaluation   PLAN FOR NEXT SESSION: did ionto have an effect? Cont to progress HEP, UBE . [place] results? Add in TPDN as indicated.        Hilda Blades, PT, DPT, LAT, ATC 07/04/22  3:09 PM Phone: [phone] Fax: [phone]

## 2022-07-05 NOTE — Therapy (Unsigned)
OUTPATIENT PHYSICAL THERAPY TREATMENT NOTE   Patient Name: Sonya Luna MRN: [id number] DOB:[birth year], 72 y.o., female Today's Date: 07/06/2022  PCP: Crist Infante, D  REFERRING PROVIDER: Dr. Micheline Chapman    END OF SESSION:   PT End of Session - 07/06/22 0935     Visit Number 8    Number of Visits 12    Date for PT Re-Evaluation 07/19/22    Authorization Type [employer]    PT Start Time 0935    PT Stop Time 1015    PT Time Calculation (min) 40 min    Activity Tolerance Patient tolerated treatment well    Behavior During Therapy WFL for tasks assessed/performed              Past Medical History:  Diagnosis Date   Bone spur of foot    right foot   Mallet finger    middle finger-right hand   Past Surgical History:  Procedure Laterality Date   FOOT SURGERY Right    bone spur removal   FOOT SURGERY Left    for bone spur   Patient Active Problem List   Diagnosis Date Noted   White coat syndrome with diagnosis of hypertension 05/15/2021   Postmenopausal 05/15/2021   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 08/11/2015   Gastrocnemius muscle strain 06/28/2015   Bicipital tendonitis of right shoulder 05/12/2014   Partial tear of right rotator cuff 05/12/2014   TRIGGER FINGER, LEFT THUMB 07/15/2010   ROTATOR CUFF SYNDROME, RIGHT 12/08/2009   WRIST PAIN, RIGHT 06/23/2009   DE QUERVAIN'S TENOSYNOVITIS 06/23/2009    REFERRING DIAG:  Diagnosis  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 "I almost didn't do the exercises it felt so good". Pt having knee scope in Feb.  2024.   PAIN:  Are  you having pain? Yes:  NPRS scale: 1/10 Pain location: Right shoulder   Pain description: sharp, twinge  Aggravating factors: reaching out and back (external rotation, donning a coat) Relieving factors: rest      OBJECTIVE: (objective measures completed at initial evaluation unless otherwise dated) DIAGNOSTIC FINDINGS:  06/20/22: Biceps tendon was well-visualized in the bicipital groove in both long and short axis.  There may be a split tear in the proximal biceps tendon as it lies in the groove but the majority of the tendon appears to be intact.  AC joint shows some degenerative changes as well as a mushroom sign.  Subscapularis appears to be intact.  The supraspinatus insertion into the humeral head is poorly visualized and likely represents a tear and a retracted tendon here.  Infraspinatus shows some hypoechoic changes in the deep fibers at the insertion of the humeral head but no retraction.  Glenohumeral joint is well-visualized without abnormality. Findings are concerning for a torn and retracted supraspinatus tendon.    PATIENT SURVEYS:  EVAL: FOTO 51% 07/04/2021: 57%  POSTURE: Very minimal rounding through upper back, Rt GH internally rotated.    UPPER EXTREMITY ROM:    Active ROM Right eval Left eval Rt  07/06/22  Shoulder flexion 156 min pain, pop 160   Shoulder extension       Shoulder abduction 152 , min pop 160   Shoulder adduction       Shoulder internal rotation WFL mid back  WFL mid back  Mid back equal to R   Shoulder external rotation T2 T2 T2 , 60 deg in 90 deg abd   Elbow flexion       Elbow extension       Wrist flexion       Wrist extension       Wrist ulnar deviation       Wrist radial deviation       Wrist pronation       Wrist supination       (Blank rows = not tested)   UPPER EXTREMITY MMT:   MMT Right eval Left eval Right 07/06/2021  Shoulder flexion 3/5 4+/5 4/5 pain   Shoulder extension       Shoulder abduction 3+/5 4+/5 4/5   Shoulder  adduction       Shoulder internal rotation 5/5 5/5    Shoulder external rotation 3/5 4/5  pop 3+/5  Middle trapezius 4/5     Lower trapezius       Elbow flexion 4+/5 4+/5   Elbow extension       Wrist flexion       Wrist extension       Wrist ulnar deviation       Wrist radial deviation       Wrist pronation       Wrist supination       Grip strength (lbs) 22 34   (Blank rows = not tested)   SHOULDER SPECIAL TESTS: Impingement tests: Painful arc test: negative SLAP lesions:  NT Instability tests:  NT Rotator cuff assessment: Drop arm test: negative, Empty can test: positive , and Full can test: positive  Biceps assessment:  NT   JOINT MOBILITY TESTING:  Normal post capsule bilaterally    PALPATION:  TTP Rt anterior/lateral deltoid              TODAY'S TREATMENT:               [facility] Adult PT Treatment:                                                DATE: 07/05/22 Therapeutic Exercise: UBE L2 for 6 min  Measure functional reach and ROM  Standing flexion with looped band at wall, full range then partial range  Quadruped cat and camel  Quadruped alt. UE flexion x 8 Quadruped reverse fly 2 lbs x 10 each side  1/2 kneeling row 10 lbs each side  x 15  Prone scapular retraction x 10 , added extension x 10  Sidelying shoulder ER no wgt x 15, a few with 2 lbs wgt Abd and reverse fly (2 lbs and also green TB )   Modalities: Ionto patch 6 hour patch 1 cc dexamethasone to Rt post cuff     [facility] Adult PT Treatment:  DATE: 06/27/22 Therapeutic Exercise: UBE L1 x 4 min (2 fwd/bwd) while taking subjective Sidelying cross body stretch 2 x 30 sec Supine serratus press 2 x 15 Supine shoulder flexion with yellow loop at wrist 2 x 10 Sidelying ER with 1# 2 x 10 Prone I, T, Y x 10 each Prone ER at 90 deg abduction x 10 Manual Therapy: Skilled palpation and monitoring of muscle tension while performing TPDN treatment STM right posterior  cuff region Trigger Point Dry Needling Treatment: Pre-treatment instruction: Patient instructed on dry needling rationale, procedures, and possible side effects including pain during treatment (achy,cramping feeling), bruising, drop of blood, lightheadedness, nausea, sweating. Patient Consent Given: Yes Education handout provided: No Muscles treated: right infraspinatus  Needle size and number: .30x48m x 5 Electrical stimulation performed: No Parameters: N/A Treatment response/outcome: Twitch response elicited and Palpable decrease in muscle tension Post-treatment instructions: Patient instructed to expect possible mild to moderate muscle soreness later today and/or tomorrow. Patient instructed in methods to reduce muscle soreness and to continue prescribed HEP. If patient was dry needled over the lung field, patient was instructed on signs and symptoms of pneumothorax and, however unlikely, to see immediate medical attention should they occur. Patient was also educated on signs and symptoms of infection and to seek medical attention should they occur. Patient verbalized understanding of these instructions and education.   [facility]                                                [facility]: [facility] Therapeutic Exercise: UBE level 1 , 6 min, 3 min each direction  Seated isometric R adduction with soft ball Red TB for shoulder strength: horizontal pull, abduction, flexion to 90 deg all x 10-15 reps External rotation into soft ball into wall x 15  UE Ranger flexion overhead, abduction /adduction, scaption Sidelying reverse fly 2 lbs x 15 Sidelying abduction 1 lbs x 15  Sidelying ER x 10 no wgt Manual Therapy: Rt PROM all planes  Post capsule stretch  Modalities: Ionto patch Rt supraspinatus 6 hour patch 1 cc dexamethasone Precautions, what to look for, expectations    [facility] Adult PT Treatment:                                                DATE: 06/21/22 Therapeutic  Exercise: UBE  L2  5 minutes after TPDN Standing Red band ER and IR - pain with ER reduced to yellow. Needs tactile cues to avoid compensation  S/L ER AROM 10 x 2  Isometrics 5 sec  x 10  Manual Therapy: Passive stretching shoulder IR and ER  Trigger Point Dry-Needling performed by CHilda Blades DPT Treatment instructions: Expect mild to moderate muscle soreness. S/S of pneumothorax if dry needled over a lung field, and to seek immediate medical attention should they occur. Patient verbalized understanding of these instructions and education.   Patient Consent Given: Yes Education handout provided: Yes Muscles treated: Infraspinatus and middle deltoid Electrical stimulation performed: No Parameters: N/A Treatment response/outcome: twitch response     PATIENT EDUCATION: Education details: HEP update, TPDN Person educated: Patient Education method: Explanation, Demonstration, and Handouts Education comprehension: verbalized understanding and returned demonstration   HOME EXERCISE PROGRAM: Access  Code: [email or web address]     ASSESSMENT: CLINICAL IMPRESSION: Patient with good result following TrP DN to Rt post cuff.  She had virtually no pain the next day and today still felt good.  Working on strengthening while minimizing compensating in Rt scapula and elbow. Repeated ionto and made another appt for dry needling in 2 weeks. Cont POC.    OBJECTIVE IMPAIRMENTS: decreased mobility, decreased ROM, decreased strength, increased fascial restrictions, impaired UE functional use, and pain.    ACTIVITY LIMITATIONS: lifting, sleeping, bathing, and reach over head   PARTICIPATION LIMITATIONS: community activity   PERSONAL FACTORS: Past/current experiences and 1 comorbidity: previous Rt UE pain, injury  are also affecting patient's functional outcome.      GOALS: Goals reviewed with patient? Yes   LONG TERM GOALS: Target date: 07/19/2022   Pt will be able to sleep without limitation of pain in  Rt UE Baseline:  Goal status:ongoing , improving already    2.  Pt will be able to complete strength training at the gym (group class with modifications) and no increase in shoulder pain.  Baseline:  Goal status: ongoing    3.  Pt will be report no pain at rest or with simple ADLs  Baseline: usually 5/10  Goal status: ongoing    4.  Pt will be able to improve R ER strength 4+/5 or better  Baseline:  Goal status: ongoing    5.  FOTO score will improve to 68% or greater as a proxy for improved Rt shoulder function.  Baseline: 55% Goal status: ongoing    6.  Pt will I with HEP for Rt UE strength and mobility Baseline:  Goal status: ongoing     PLAN: PT FREQUENCY: 2x/week   PT DURATION: 6 weeks   PLANNED INTERVENTIONS: Therapeutic exercises, Therapeutic activity, Neuromuscular re-education, Balance training, Gait training, Patient/Family education, Self Care, Joint mobilization, Dry Needling, Cryotherapy, Moist heat, Ionotophoresis '4mg'$ /ml Dexamethasone, Manual therapy, and Re-evaluation   PLAN FOR NEXT SESSION: did ionto have an effect? Cont to progress HEP, UBE . [place] results? Add in TPDN as indicated.       Raeford Razor, PT 07/06/22 12:00 PM Phone: [phone] Fax: [phone]

## 2022-07-06 ENCOUNTER — Encounter: Payer: Self-pay | Admitting: Physical Therapy

## 2022-07-06 ENCOUNTER — Ambulatory Visit: Payer: PPO | Admitting: Physical Therapy

## 2022-07-06 DIAGNOSIS — G8929 Other chronic pain: Secondary | ICD-10-CM

## 2022-07-06 DIAGNOSIS — M6281 Muscle weakness (generalized): Secondary | ICD-10-CM

## 2022-07-06 DIAGNOSIS — M25511 Pain in right shoulder: Secondary | ICD-10-CM | POA: Diagnosis not present

## 2022-07-10 NOTE — Therapy (Deleted)
OUTPATIENT PHYSICAL THERAPY TREATMENT NOTE   Patient Name: Sonya Luna MRN: [id number] DOB:[birth year], 72 y.o., female 67 Date: 07/10/2022  PCP: Crist Infante, D  REFERRING PROVIDER: Dr. 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 08/11/2015   Gastrocnemius muscle strain 06/28/2015   Bicipital tendonitis of right shoulder 05/12/2014   Partial tear of right rotator cuff 05/12/2014   TRIGGER FINGER, LEFT THUMB 07/15/2010   ROTATOR CUFF SYNDROME, RIGHT 12/08/2009   WRIST PAIN, RIGHT 06/23/2009   DE QUERVAIN'S TENOSYNOVITIS 06/23/2009    REFERRING DIAG:  Diagnosis  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 "I almost didn't do the exercises it felt so good". Pt having knee scope in Feb.  2024.   PAIN:  Are you having pain? Yes:  NPRS scale: 1/10 Pain location: Right shoulder   Pain description: sharp, twinge  Aggravating factors: reaching out and back (external rotation, donning a coat) Relieving factors: rest      OBJECTIVE: (objective measures completed at initial evaluation unless otherwise dated) DIAGNOSTIC FINDINGS:  06/20/22: Biceps tendon was well-visualized  in the bicipital groove in both long and short axis.  There may be a split tear in the proximal biceps tendon as it lies in the groove but the majority of the tendon appears to be intact.  AC joint shows some degenerative changes as well as a mushroom sign.  Subscapularis appears to be intact.  The supraspinatus insertion into the humeral head is poorly visualized and likely represents a tear and a retracted tendon here.  Infraspinatus shows some hypoechoic changes in the deep fibers at the insertion of the humeral head but no retraction.  Glenohumeral joint is well-visualized without abnormality. Findings are concerning for a torn and retracted supraspinatus tendon.    PATIENT SURVEYS:  EVAL: FOTO 51% 07/04/2021: 57%  POSTURE: Very minimal rounding through upper back, Rt GH internally rotated.    UPPER EXTREMITY ROM:    Active ROM Right eval Left eval Rt  07/06/22  Shoulder flexion 156 min pain, pop 160   Shoulder extension       Shoulder abduction 152 , min pop 160   Shoulder adduction       Shoulder internal rotation WFL mid back  WFL mid back  Mid back equal to R   Shoulder external rotation T2 T2  T2 , 60 deg in 90 deg abd   Elbow flexion       Elbow extension       Wrist flexion       Wrist extension       Wrist ulnar deviation       Wrist radial deviation       Wrist pronation       Wrist supination       (Blank rows = not tested)   UPPER EXTREMITY MMT:   MMT Right eval Left eval Right 07/06/2021  Shoulder flexion 3/5 4+/5 4/5 pain   Shoulder extension       Shoulder abduction 3+/5 4+/5 4/5   Shoulder adduction       Shoulder internal rotation 5/5 5/5    Shoulder external rotation 3/5 4/5  pop 3+/5  Middle trapezius 4/5     Lower trapezius       Elbow flexion 4+/5 4+/5   Elbow extension       Wrist flexion       Wrist extension       Wrist ulnar deviation       Wrist radial deviation       Wrist pronation       Wrist supination       Grip strength (lbs) 22 34    (Blank rows = not tested)   SHOULDER SPECIAL TESTS: Impingement tests: Painful arc test: negative SLAP lesions:  NT Instability tests:  NT Rotator cuff assessment: Drop arm test: negative, Empty can test: positive , and Full can test: positive  Biceps assessment:  NT   JOINT MOBILITY TESTING:  Normal post capsule bilaterally    PALPATION:  TTP Rt anterior/lateral deltoid              TODAY'S TREATMENT:    [facility] Adult PT Treatment:                                                DATE: 07/11/22 Therapeutic Exercise: *** Manual Therapy: *** Neuromuscular re-ed: *** Therapeutic Activity: *** Modalities: *** Self Care: ***              [facility] Adult PT Treatment:                                                DATE: 07/05/22 Therapeutic Exercise: UBE L2 for 6 min  Measure functional reach and ROM  Standing flexion with looped band at wall, full range then partial range  Quadruped cat and camel  Quadruped alt. UE flexion x 8 Quadruped reverse fly 2 lbs x 10 each side  1/2 kneeling row 10 lbs each side  x 15  Prone scapular retraction x 10 , added extension x 10  Sidelying shoulder ER no wgt x 15, a few with 2 lbs wgt Abd and reverse fly (2 lbs and also green TB )   Modalities: Ionto patch 6 hour patch 1 cc dexamethasone to Rt post cuff     [facility] Adult PT Treatment:  DATE: 06/27/22 Therapeutic Exercise: UBE L1 x 4 min (2 fwd/bwd) while taking subjective Sidelying cross body stretch 2 x 30 sec Supine serratus press 2 x 15 Supine shoulder flexion with yellow loop at wrist 2 x 10 Sidelying ER with 1# 2 x 10 Prone I, T, Y x 10 each Prone ER at 90 deg abduction x 10 Manual Therapy: Skilled palpation and monitoring of muscle tension while performing TPDN treatment STM right posterior cuff region Trigger Point Dry Needling Treatment: Pre-treatment instruction: Patient instructed on dry needling rationale, procedures, and possible  side effects including pain during treatment (achy,cramping feeling), bruising, drop of blood, lightheadedness, nausea, sweating. Patient Consent Given: Yes Education handout provided: No Muscles treated: right infraspinatus  Needle size and number: .30x69m x 5 Electrical stimulation performed: No Parameters: N/A Treatment response/outcome: Twitch response elicited and Palpable decrease in muscle tension Post-treatment instructions: Patient instructed to expect possible mild to moderate muscle soreness later today and/or tomorrow. Patient instructed in methods to reduce muscle soreness and to continue prescribed HEP. If patient was dry needled over the lung field, patient was instructed on signs and symptoms of pneumothorax and, however unlikely, to see immediate medical attention should they occur. Patient was also educated on signs and symptoms of infection and to seek medical attention should they occur. Patient verbalized understanding of these instructions and education.   [facility]                                                [facility]: 06/27[facility]22 Therapeutic Exercise: UBE level 1 , 6 min, 3 min each direction  Seated isometric R adduction with soft ball Red TB for shoulder strength: horizontal pull, abduction, flexion to 90 deg all x 10-15 reps External rotation into soft ball into wall x 15  UE Ranger flexion overhead, abduction /adduction, scaption Sidelying reverse fly 2 lbs x 15 Sidelying abduction 1 lbs x 15  Sidelying ER x 10 no wgt Manual Therapy: Rt PROM all planes  Post capsule stretch  Modalities: Ionto patch Rt supraspinatus 6 hour patch 1 cc dexamethasone Precautions, what to look for, expectations    [facility] Adult PT Treatment:                                                DATE: 06/21/22 Therapeutic Exercise: UBE  L2  5 minutes after TPDN Standing Red band ER and IR - pain with ER reduced to yellow. Needs tactile cues to avoid compensation  S/L ER AROM 10  x 2  Isometrics 5 sec  x 10  Manual Therapy: Passive stretching shoulder IR and ER  Trigger Point Dry-Needling performed by CHilda Blades DPT Treatment instructions: Expect mild to moderate muscle soreness. S/S of pneumothorax if dry needled over a lung field, and to seek immediate medical attention should they occur. Patient verbalized understanding of these instructions and education.   Patient Consent Given: Yes Education handout provided: Yes Muscles treated: Infraspinatus and middle deltoid Electrical stimulation performed: No Parameters: N/A Treatment response/outcome: twitch response     PATIENT EDUCATION: Education details: HEP update, TPDN Person educated: Patient Education method: Explanation, Demonstration, and Handouts Education comprehension: verbalized understanding and returned demonstration   HOME EXERCISE PROGRAM: Access  Code: [email or web address]     ASSESSMENT: CLINICAL IMPRESSION: Patient with good result following TrP DN to Rt post cuff.  She had virtually no pain the next day and today still felt good.  Working on strengthening while minimizing compensating in Rt scapula and elbow. Repeated ionto and made another appt for dry needling in 2 weeks. Cont POC.    OBJECTIVE IMPAIRMENTS: decreased mobility, decreased ROM, decreased strength, increased fascial restrictions, impaired UE functional use, and pain.    ACTIVITY LIMITATIONS: lifting, sleeping, bathing, and reach over head   PARTICIPATION LIMITATIONS: community activity   PERSONAL FACTORS: Past/current experiences and 1 comorbidity: previous Rt UE pain, injury  are also affecting patient's functional outcome.      GOALS: Goals reviewed with patient? Yes   LONG TERM GOALS: Target date: 07/19/2022   Pt will be able to sleep without limitation of pain in Rt UE Baseline:  Goal status:ongoing , improving already    2.  Pt will be able to complete strength training at the gym (group class with modifications) and  no increase in shoulder pain.  Baseline:  Goal status: ongoing    3.  Pt will be report no pain at rest or with simple ADLs  Baseline: usually 5/10  Goal status: ongoing    4.  Pt will be able to improve R ER strength 4+/5 or better  Baseline:  Goal status: ongoing    5.  FOTO score will improve to 68% or greater as a proxy for improved Rt shoulder function.  Baseline: 55% Goal status: ongoing    6.  Pt will I with HEP for Rt UE strength and mobility Baseline:  Goal status: ongoing     PLAN: PT FREQUENCY: 2x/week   PT DURATION: 6 weeks   PLANNED INTERVENTIONS: Therapeutic exercises, Therapeutic activity, Neuromuscular re-education, Balance training, Gait training, Patient/Family education, Self Care, Joint mobilization, Dry Needling, Cryotherapy, Moist heat, Ionotophoresis '4mg'$ /ml Dexamethasone, Manual therapy, and Re-evaluation   PLAN FOR NEXT SESSION: did ionto have an effect? Cont to progress HEP, UBE . [place] results? Add in TPDN as indicated.       Raeford Razor, PT 07/10/22 11:44 AM Phone: [phone] Fax: [phone]

## 2022-07-11 ENCOUNTER — Encounter: Payer: PPO | Admitting: Physical Therapy

## 2022-07-12 NOTE — Therapy (Unsigned)
OUTPATIENT PHYSICAL THERAPY TREATMENT NOTE   Patient Name: Sonya Luna MRN: [id number] DOB:1950-07-13, 72 y.o., female 63 Date: 07/13/2022  PCP: Crist Infante, D  REFERRING PROVIDER: Dr. Micheline Chapman    END OF SESSION:   PT End of Session - 07/13/22 0937     Visit Number 9    Number of Visits 12    Date for PT Re-Evaluation 07/19/22    Authorization Type [employer]    PT Start Time 0935    PT Stop Time 4098    PT Time Calculation (min) 40 min    Activity Tolerance Patient tolerated treatment well    Behavior During Therapy WFL for tasks assessed/performed               Past Medical History:  Diagnosis Date   Bone spur of foot    right foot   Mallet finger    middle finger-right hand   Past Surgical History:  Procedure Laterality Date   FOOT SURGERY Right    bone spur removal   FOOT SURGERY Left    for bone spur   Patient Active Problem List   Diagnosis Date Noted   White coat syndrome with diagnosis of hypertension 05/15/2021   Postmenopausal 05/15/2021   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 08/11/2015   Gastrocnemius muscle strain 06/28/2015   Bicipital tendonitis of right shoulder 05/12/2014   Partial tear of right rotator cuff 05/12/2014   TRIGGER FINGER, LEFT THUMB 07/15/2010   ROTATOR CUFF SYNDROME, RIGHT 12/08/2009   WRIST PAIN, RIGHT 06/23/2009   DE QUERVAIN'S TENOSYNOVITIS 06/23/2009    REFERRING DIAG:  Diagnosis  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  Missed Tues because PT was out. Was not offered another slot that day. Pt agreeable to Pilates today, has had  previous experience with Reformer years ago.    PAIN:  Are you having pain? Yes:  NPRS scale: 0/10 Pain location: Right shoulder   Pain description: sharp, twinge  Aggravating factors: reaching out and back (external rotation, donning a coat) Relieving factors: rest      OBJECTIVE: (objective measures completed at initial evaluation unless otherwise dated) DIAGNOSTIC FINDINGS:  06/20/22: Biceps tendon was well-visualized in the bicipital groove in both long and short axis.  There may be a split tear in the proximal biceps tendon as it lies in the groove but the majority of the tendon appears to be intact.  AC joint shows some degenerative changes as well as a mushroom sign.  Subscapularis appears to be intact.  The supraspinatus insertion into the humeral head is poorly visualized and likely represents a tear and a retracted tendon here.  Infraspinatus shows some hypoechoic changes in the deep fibers at the insertion of the humeral head but no retraction.  Glenohumeral joint is well-visualized without abnormality. Findings are concerning for a torn and retracted supraspinatus  tendon.    PATIENT SURVEYS:  EVAL: FOTO 51% 07/04/2021: 57%  POSTURE: Very minimal rounding through upper back, Rt GH internally rotated.    UPPER EXTREMITY ROM:    Active ROM Right eval Left eval Rt  07/06/22  Shoulder flexion 156 min pain, pop 160   Shoulder extension       Shoulder abduction 152 , min pop 160   Shoulder adduction       Shoulder internal rotation WFL mid back  WFL mid back  Mid back equal to R   Shoulder external rotation T2 T2 T2 , 60 deg in 90 deg abd   Elbow flexion       Elbow extension       Wrist flexion       Wrist extension       Wrist ulnar deviation       Wrist radial deviation       Wrist pronation       Wrist supination       (Blank rows = not tested)   UPPER EXTREMITY MMT:   MMT Right eval Left eval Right 07/06/2021  Shoulder flexion 3/5 4+/5 4/5 pain   Shoulder  extension       Shoulder abduction 3+/5 4+/5 4/5   Shoulder adduction       Shoulder internal rotation 5/5 5/5    Shoulder external rotation 3/5 4/5  pop 3+/5  Middle trapezius 4/5     Lower trapezius       Elbow flexion 4+/5 4+/5   Elbow extension       Wrist flexion       Wrist extension       Wrist ulnar deviation       Wrist radial deviation       Wrist pronation       Wrist supination       Grip strength (lbs) 22 34   (Blank rows = not tested)   SHOULDER SPECIAL TESTS: Impingement tests: Painful arc test: negative SLAP lesions:  NT Instability tests:  NT Rotator cuff assessment: Drop arm test: negative, Empty can test: positive , and Full can test: positive  Biceps assessment:  NT   JOINT MOBILITY TESTING:  Normal post capsule bilaterally    PALPATION:  TTP Rt anterior/lateral deltoid              TODAY'S TREATMENT:    [facility] Adult PT Treatment:                                                DATE: 07/13/22 Therapeutic Exercise: Pilates Reformer used for LE/core strength, postural strength, lumbopelvic disassociation and core control.  Exercises included: Footwork 2 red 1 blue 1 Yellow : heels in parallel and turnout, narrow and wide Supine Arm 1 red 1 yellow: Arcs in parallel, circles and triceps Quadruped 1 red UE press out, added lower body Seated arms ER, yellow spring , fly Bicep curl 1 blue facing back x 10 added hinge for core  Long box Prone 1 red overhead press double arm then single arm               [facility] Adult PT Treatment:  DATE: 07/05/22 Therapeutic Exercise: UBE L2 for 6 min  Measure functional reach and ROM  Standing flexion with looped band at wall, full range then partial range  Quadruped cat and camel  Quadruped alt. UE flexion x 8 Quadruped reverse fly 2 lbs x 10 each side  1/2 kneeling row 10 lbs each side  x 15  Prone scapular retraction x 10 , added extension x 10  Sidelying shoulder ER no wgt  x 15, a few with 2 lbs wgt Abd and reverse fly (2 lbs and also green TB )   Modalities: Ionto patch 6 hour patch 1 cc dexamethasone to Rt post cuff     [facility] Adult PT Treatment:                                                DATE: 06/27/22 Therapeutic Exercise: UBE L1 x 4 min (2 fwd/bwd) while taking subjective Sidelying cross body stretch 2 x 30 sec Supine serratus press 2 x 15 Supine shoulder flexion with yellow loop at wrist 2 x 10 Sidelying ER with 1# 2 x 10 Prone I, T, Y x 10 each Prone ER at 90 deg abduction x 10 Manual Therapy: Skilled palpation and monitoring of muscle tension while performing TPDN treatment STM right posterior cuff region Trigger Point Dry Needling Treatment: Pre-treatment instruction: Patient instructed on dry needling rationale, procedures, and possible side effects including pain during treatment (achy,cramping feeling), bruising, drop of blood, lightheadedness, nausea, sweating. Patient Consent Given: Yes Education handout provided: No Muscles treated: right infraspinatus  Needle size and number: .30x59m x 5 Electrical stimulation performed: No Parameters: N/A Treatment response/outcome: Twitch response elicited and Palpable decrease in muscle tension Post-treatment instructions: Patient instructed to expect possible mild to moderate muscle soreness later today and/or tomorrow. Patient instructed in methods to reduce muscle soreness and to continue prescribed HEP. If patient was dry needled over the lung field, patient was instructed on signs and symptoms of pneumothorax and, however unlikely, to see immediate medical attention should they occur. Patient was also educated on signs and symptoms of infection and to seek medical attention should they occur. Patient verbalized understanding of these instructions and education.   [facility]                                                [facility]: [facility] Therapeutic Exercise: UBE level 1 , 6 min, 3  min each direction  Seated isometric R adduction with soft ball Red TB for shoulder strength: horizontal pull, abduction, flexion to 90 deg all x 10-15 reps External rotation into soft ball into wall x 15  UE Ranger flexion overhead, abduction /adduction, scaption Sidelying reverse fly 2 lbs x 15 Sidelying abduction 1 lbs x 15  Sidelying ER x 10 no wgt Manual Therapy: Rt PROM all planes  Post capsule stretch  Modalities: Ionto patch Rt supraspinatus 6 hour patch 1 cc dexamethasone Precautions, what to look for, expectations    [facility]  [facility]: [facility]22 Therapeutic Exercise: UBE  L2  5 minutes after TPDN Standing Red band ER and IR - pain with ER reduced to yellow. Needs tactile cues to avoid compensation  S/L ER AROM 10 x 2  Isometrics 5 sec  x 10  Manual Therapy: Passive stretching shoulder IR and ER  Trigger Point Dry-Needling performed by Hilda Blades, DPT Treatment instructions: Expect mild to moderate muscle soreness. S/S of pneumothorax if dry needled over a lung field, and to seek immediate medical attention should they occur. Patient verbalized understanding of these instructions and education.   Patient Consent Given: Yes Education handout provided: Yes Muscles treated: Infraspinatus and middle deltoid Electrical stimulation performed: No Parameters: N/A Treatment response/outcome: twitch response     PATIENT EDUCATION: Education details: HEP update, TPDN Person educated: Patient Education method: Explanation, Demonstration, and Handouts Education comprehension: verbalized understanding and returned demonstration   HOME EXERCISE PROGRAM: Access Code: [email or web address]     ASSESSMENT: CLINICAL IMPRESSION: Patient was challenged by Reformer exercises today, introduced to Pilates equipment and principles of alignment, core control. She did have shoulder pain (bilateral) and difficulty with external rotation and  upper back work.  She would like to add in Pilates sesion as this is something she would like to continue in the community.  She is also having L knee pain and has a surgery upcoming.  Pilates can potentially help her with that as well.    OBJECTIVE IMPAIRMENTS: decreased mobility, decreased ROM, decreased strength, increased fascial restrictions, impaired UE functional use, and pain.    ACTIVITY LIMITATIONS: lifting, sleeping, bathing, and reach over head   PARTICIPATION LIMITATIONS: community activity   PERSONAL FACTORS: Past/current experiences and 1 comorbidity: previous Rt UE pain, injury  are also affecting patient's functional outcome.      GOALS: Goals reviewed with patient? Yes   LONG TERM GOALS: Target date: 07/19/2022   Pt will be able to sleep without limitation of pain in Rt UE Baseline:  Goal status:ongoing , improving already    2.  Pt will be able to complete strength training at the gym (group class with modifications) and no increase in shoulder pain.  Baseline:  Goal status: ongoing    3.  Pt will be report no pain at rest or with simple ADLs  Baseline: usually 5/10  Goal status: ongoing    4.  Pt will be able to improve R ER strength 4+/5 or better  Baseline:  Goal status: ongoing    5.  FOTO score will improve to 68% or greater as a proxy for improved Rt shoulder function.  Baseline: 55% Goal status: ongoing    6.  Pt will I be I HEP for Rt UE strength and mobility Baseline:  Goal status: ongoing     PLAN: PT FREQUENCY: 2x/week   PT DURATION: 6 weeks   PLANNED INTERVENTIONS: Therapeutic exercises, Therapeutic activity, Neuromuscular re-education, Balance training, Gait training, Patient/Family education, Self Care, Joint mobilization, Dry Needling, Cryotherapy, Moist heat, Ionotophoresis '4mg'$ /ml Dexamethasone, Manual therapy, and Re-evaluation   PLAN FOR NEXT SESSION: repeat DN? Cont strength and stability.  Closed chain , plank work        Raeford Razor, PT 07/13/22 12:11 PM Phone: [phone] Fax: [phone]

## 2022-07-13 ENCOUNTER — Encounter: Payer: Self-pay | Admitting: Physical Therapy

## 2022-07-13 ENCOUNTER — Ambulatory Visit: Payer: PPO | Admitting: Physical Therapy

## 2022-07-13 DIAGNOSIS — M6281 Muscle weakness (generalized): Secondary | ICD-10-CM

## 2022-07-13 DIAGNOSIS — M25511 Pain in right shoulder: Secondary | ICD-10-CM | POA: Diagnosis not present

## 2022-07-13 DIAGNOSIS — G8929 Other chronic pain: Secondary | ICD-10-CM

## 2022-07-17 ENCOUNTER — Ambulatory Visit: Payer: PPO | Admitting: Physical Therapy

## 2022-07-17 ENCOUNTER — Other Ambulatory Visit: Payer: Self-pay

## 2022-07-17 ENCOUNTER — Encounter: Payer: Self-pay | Admitting: Physical Therapy

## 2022-07-17 DIAGNOSIS — M25511 Pain in right shoulder: Secondary | ICD-10-CM | POA: Diagnosis not present

## 2022-07-17 DIAGNOSIS — G8929 Other chronic pain: Secondary | ICD-10-CM

## 2022-07-17 DIAGNOSIS — M6281 Muscle weakness (generalized): Secondary | ICD-10-CM

## 2022-07-17 NOTE — Therapy (Signed)
OUTPATIENT PHYSICAL THERAPY TREATMENT NOTE   Progress Note Reporting Period 06/08/2023 to 07/17/2022  See note below for Objective Data and Assessment of Progress/Goals.     Patient Name: Sonya Luna MRN: [id number] DOB:August 26, 1950, 72 y.o., female Today's Date: 07/17/2022  PCP: Crist Infante, D  REFERRING PROVIDER: Dr. Micheline Chapman    END OF SESSION:   PT End of Session - 07/17/22 1058     Visit Number 10    Number of Visits 12    Date for PT Re-Evaluation 07/19/22    Authorization Type [employer]    PT Start Time 1100    PT Stop Time 1145    PT Time Calculation (min) 45 min    Activity Tolerance Patient tolerated treatment well    Behavior During Therapy WFL for tasks assessed/performed                Past Medical History:  Diagnosis Date   Bone spur of foot    right foot   Mallet finger    middle finger-right hand   Past Surgical History:  Procedure Laterality Date   FOOT SURGERY Right    bone spur removal   FOOT SURGERY Left    for bone spur   Patient Active Problem List   Diagnosis Date Noted   White coat syndrome with diagnosis of hypertension 05/15/2021   Postmenopausal 05/15/2021   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 08/11/2015   Gastrocnemius muscle strain 06/28/2015   Bicipital tendonitis of right shoulder 05/12/2014   Partial tear of right rotator cuff 05/12/2014   TRIGGER FINGER, LEFT THUMB 07/15/2010   ROTATOR CUFF SYNDROME, RIGHT 12/08/2009   WRIST PAIN, RIGHT 06/23/2009   DE QUERVAIN'S TENOSYNOVITIS 06/23/2009    REFERRING DIAG:  Diagnosis  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 She is able to sleep more comfortably. Just a little bit of pain in the shoulder currently.   PAIN:  Are you having pain? Yes:  NPRS scale: 2-3/10 Pain location: Right shoulder   Pain description: sharp, twinge  Aggravating factors: reaching out and back (external rotation, donning a coat) Relieving factors: rest      OBJECTIVE: (objective measures completed at initial evaluation unless otherwise dated) DIAGNOSTIC FINDINGS:  06/20/22: Biceps tendon was well-visualized in the bicipital groove in both long and short axis.  There may be a split tear in the proximal biceps tendon as it lies in the groove but the majority of the tendon appears to be intact.  AC joint shows some degenerative changes as well as a mushroom sign.  Subscapularis appears to be intact.  The supraspinatus insertion into the humeral head is poorly visualized and likely represents a tear and a retracted tendon here.  Infraspinatus shows some hypoechoic changes in the deep fibers at the insertion of the humeral head  but no retraction.  Glenohumeral joint is well-visualized without abnormality. Findings are concerning for a torn and retracted supraspinatus tendon.    PATIENT SURVEYS:  EVAL: FOTO 51% 07/04/2022: 57% 07/17/2022: 51%  POSTURE: Very minimal rounding through upper back, Rt GH internally rotated.    UPPER EXTREMITY ROM:    Active ROM Right eval Left eval Rt  07/06/22  Shoulder flexion 156 min pain, pop 160   Shoulder extension       Shoulder abduction 152 , min pop 160   Shoulder adduction       Shoulder internal rotation WFL mid back  WFL mid back  Mid back equal to R   Shoulder external rotation T2 T2 T2 , 60 deg in 90 deg abd   Elbow flexion       Elbow extension       Wrist flexion       Wrist extension       Wrist ulnar deviation       Wrist radial deviation       Wrist pronation       Wrist supination       (Blank rows = not tested)    UPPER EXTREMITY MMT:   MMT Right eval Left eval Right 07/06/2021 Right 07/17/2022  Shoulder flexion 3/5 4+/5 4/5 pain    Shoulder extension        Shoulder abduction 3+/5 4+/5 4/5    Shoulder adduction        Shoulder internal rotation 5/5 5/5     Shoulder external rotation 3/5 4/5  pop 3+/5 3+/5  Middle trapezius 4/5      Lower trapezius        Elbow flexion 4+/5 4+/5    Elbow extension        Wrist flexion        Wrist extension        Wrist ulnar deviation        Wrist radial deviation        Wrist pronation        Wrist supination        Grip strength (lbs) 22 34    (Blank rows = not tested)   SHOULDER SPECIAL TESTS: Impingement tests: Painful arc test: negative SLAP lesions:  NT Instability tests:  NT Rotator cuff assessment: Drop arm test: negative, Empty can test: positive , and Full can test: positive  Biceps assessment:  NT   JOINT MOBILITY TESTING:  Normal post capsule bilaterally    PALPATION:  TTP Rt anterior/lateral deltoid              TODAY'S TREATMENT: [facility] Adult PT Treatment:                                                DATE: 07/17/2022 Therapeutic Exercise: UBE L3 x 4 min (2 fwd/bwd) while taking subjective Sidelying thoracic rotation Supine thoracic mobs over FR at various levels Sidelying cross body stretch  Sleeper stretch Supine pec stretch on FR at various ranges Supine serratus press on FR x 15 Supine horizontal abduction with green on FR x 15 Sidelying ER 2 x 10 Prone row with 2# 2 x 10 Tall plank on elevated table with alternating shoulder taps x 20 Manual Therapy: Skilled palpation and monitoring of muscle tension while performing TPDN treatment Scap pinned shoulder flexion PROM Trigger  Point Dry Needling Treatment: Pre-treatment instruction: Patient instructed on dry needling rationale, procedures, and possible side effects including pain during treatment (achy,cramping feeling), bruising, drop of blood, lightheadedness, nausea,  sweating. Patient Consent Given: Yes Education handout provided: No Muscles treated: right infraspinatus, rhomboid/mid trap, middle deltoid  Needle size and number: .30x11m x 5 Electrical stimulation performed: No Parameters: N/A Treatment response/outcome: Twitch response elicited and Palpable decrease in muscle tension Post-treatment instructions: Patient instructed to expect possible mild to moderate muscle soreness later today and/or tomorrow. Patient instructed in methods to reduce muscle soreness and to continue prescribed HEP. If patient was dry needled over the lung field, patient was instructed on signs and symptoms of pneumothorax and, however unlikely, to see immediate medical attention should they occur. Patient was also educated on signs and symptoms of infection and to seek medical attention should they occur. Patient verbalized understanding of these instructions and education.   [facility]                                                [facility]: [facility] Therapeutic Exercise: Pilates Reformer used for LE/core strength, postural strength, lumbopelvic disassociation and core control.  Exercises included: Footwork 2 red 1 blue 1 Yellow : heels in parallel and turnout, narrow and wide Supine Arm 1 red 1 yellow: Arcs in parallel, circles and triceps Quadruped 1 red UE press out, added lower body Seated arms ER, yellow spring , fly Bicep curl 1 blue facing back x 10 added hinge for core  Long box Prone 1 red overhead press double arm then single arm              [facility] Adult PT Treatment:                                                DATE: 07/05/22 Therapeutic Exercise: UBE L2 for 6 min  Measure functional reach and ROM  Standing flexion with looped band at wall, full range then partial range  Quadruped cat and camel  Quadruped alt. UE flexion x 8 Quadruped reverse fly 2 lbs x 10 each side  1/2 kneeling row 10 lbs each side  x 15  Prone scapular retraction x 10 , added  extension x 10  Sidelying shoulder ER no wgt x 15, a few with 2 lbs wgt Abd and reverse fly (2 lbs and also green TB )  Modalities: Ionto patch 6 hour patch 1 cc dexamethasone to Rt post cuff    PATIENT EDUCATION: Education details: HEP update, TPDN Person educated: Patient Education method: Explanation, Demonstration, and Handouts Education comprehension: verbalized understanding and returned demonstration   HOME EXERCISE PROGRAM: Access Code: [email or web address]    ASSESSMENT: CLINICAL IMPRESSION: Patient tolerated therapy well with no adverse effects. Performed TPDN for infraspinatus with good therapeutic benefit and patient reporting improvement in symptoms. Therapy continues to focus on progressing rotator cuff and parascapular strengthening. She continues to demonstrate limitations with overhead strength and control and shoulder ER strength. She reports a decrease on FOTO but subjective reports improvement in pain and sleeping ability, but persistent shoulder weakness. She was provided new exercises for pec stretching and thoracic extension using FR for home. Patient would benefit from continued  skilled PT to progress her shoulder mobility and strength in order to reduce pain and maximize functional ability.     OBJECTIVE IMPAIRMENTS: decreased mobility, decreased ROM, decreased strength, increased fascial restrictions, impaired UE functional use, and pain.    ACTIVITY LIMITATIONS: lifting, sleeping, bathing, and reach over head   PARTICIPATION LIMITATIONS: community activity   PERSONAL FACTORS: Past/current experiences and 1 comorbidity: previous Rt UE pain, injury  are also affecting patient's functional outcome.      GOALS: Goals reviewed with patient? Yes   LONG TERM GOALS: Target date: 07/19/2022   Pt will be able to sleep without limitation of pain in Rt UE Baseline:  Goal status:ongoing , improving already    2.  Pt will be able to complete strength training at the gym (group  class with modifications) and no increase in shoulder pain.  Baseline:  Goal status: ongoing    3.  Pt will be report no pain at rest or with simple ADLs  Baseline: usually 5/10  Goal status: ongoing    4.  Pt will be able to improve R ER strength 4+/5 or better  Baseline:  Goal status: ongoing    5.  FOTO score will improve to 68% or greater as a proxy for improved Rt shoulder function.  Baseline: 55% Goal status: ongoing    6.  Pt will I be I HEP for Rt UE strength and mobility Baseline:  Goal status: ongoing     PLAN: PT FREQUENCY: 2x/week   PT DURATION: 6 weeks   PLANNED INTERVENTIONS: Therapeutic exercises, Therapeutic activity, Neuromuscular re-education, Balance training, Gait training, Patient/Family education, Self Care, Joint mobilization, Dry Needling, Cryotherapy, Moist heat, Ionotophoresis '4mg'$ /ml Dexamethasone, Manual therapy, and Re-evaluation   PLAN FOR NEXT SESSION: repeat DN? Cont strength and stability.  Closed chain , plank work       Hilda Blades, PT, DPT, LAT, ATC 07/17/22  12:01 PM Phone: [phone] Fax: [phone]

## 2022-07-18 ENCOUNTER — Ambulatory Visit: Payer: PPO | Admitting: Sports Medicine

## 2022-07-18 VITALS — BP 132/86 | Ht 66.0 in | Wt 128.0 lb

## 2022-07-18 DIAGNOSIS — M75101 Unspecified rotator cuff tear or rupture of right shoulder, not specified as traumatic: Secondary | ICD-10-CM

## 2022-07-18 NOTE — Progress Notes (Signed)
   Subjective:    Patient ID: Sonya Luna, female    DOB: [birth year], 72 y.o.   MRN: [id number]  HPI  Sonya Luna presents today for follow-up on right shoulder pain secondary to a rotator cuff tear.  Ultrasound done in December showed findings consistent with a torn and retracted supraspinatus tendon.  She has been working diligently in physical therapy and is making progress.  Her mobility has improved.  She is sleeping better at night.  Her main complaint is continued weakness especially with external rotation.  This is most noticeable when working out.  Her activities of daily living, such as pain that she was getting with stirring, have improved.  Of note, she is planning on having her left knee arthroscopy done on February 8 for a torn meniscus.  Review of Systems As above    Objective:   Physical Exam  Well-developed, well-nourished.  No acute distress  Right shoulder: Good range of motion.  She has pain with resisted supraspinatus but good strength.  She has pain with resisted supraspinatus but good strength.  She does have pain and weakness with resisted external rotation.  Neurovascularly intact distally.      Assessment & Plan:   Right shoulder pain secondary to rotator cuff tear Left knee medial meniscus tear  Sonya Luna we will proceed with left knee arthroscopy on February 8 with Dr. Rhona Raider.  She will continue with physical therapy for her rotator cuff tear and will wean to home exercise program per the therapist discretion.  I am going to wait on any further workup and treatment of the right shoulder given her overall improvement as well as her upcoming left knee arthroscopy.  I did discuss the possibility of an MRI at a later date if her symptoms warrant.  Otherwise follow-up as needed.  This note was dictated using Dragon naturally speaking software and may contain errors in syntax, spelling, or content which have not been identified prior to signing this note.

## 2022-07-19 ENCOUNTER — Encounter: Payer: PPO | Admitting: Physical Therapy

## 2022-07-19 NOTE — Therapy (Signed)
OUTPATIENT PHYSICAL THERAPY TREATMENT NOTE     Patient Name: Sonya Luna MRN: [id number] DOB:1951/01/16, 72 y.o., female Today's Date: 07/20/2022  PCP: Crist Infante, D  REFERRING PROVIDER: Dr. Micheline Chapman    END OF SESSION:   PT End of Session - 07/20/22 1240     Visit Number 11    Number of Visits 19    Date for PT Re-Evaluation 08/17/22    Authorization Type [employer]    PT Start Time 1233    PT Stop Time 1315    PT Time Calculation (min) 42 min    Activity Tolerance Patient tolerated treatment well    Behavior During Therapy WFL for tasks assessed/performed                 Past Medical History:  Diagnosis Date   Bone spur of foot    right foot   Mallet finger    middle finger-right hand   Past Surgical History:  Procedure Laterality Date   FOOT SURGERY Right    bone spur removal   FOOT SURGERY Left    for bone spur   Patient Active Problem List   Diagnosis Date Noted   White coat syndrome with diagnosis of hypertension 05/15/2021   Postmenopausal 05/15/2021   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 08/11/2015   Gastrocnemius muscle strain 06/28/2015   Bicipital tendonitis of right shoulder 05/12/2014   Partial tear of right rotator cuff 05/12/2014   TRIGGER FINGER, LEFT THUMB 07/15/2010   ROTATOR CUFF SYNDROME, RIGHT 12/08/2009   WRIST PAIN, RIGHT 06/23/2009   DE QUERVAIN'S TENOSYNOVITIS 06/23/2009    REFERRING DIAG:  Diagnosis  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 I still am so weak in both arms in that motion  Saw Dr. Micheline Chapman.  Having knee scope 2/8 Dr Rhona Raider .    PAIN:  Are you  having pain? Yes:  NPRS scale: 0/10 Pain location: Right shoulder   Pain description: sharp, twinge  Aggravating factors: reaching out and back (external rotation, donning a coat) Relieving factors: rest      OBJECTIVE: (objective measures completed at initial evaluation unless otherwise dated) DIAGNOSTIC FINDINGS:  06/20/22: Biceps tendon was well-visualized in the bicipital groove in both long and short axis.  There may be a split tear in the proximal biceps tendon as it lies in the groove but the majority of the tendon appears to be intact.  AC joint shows some degenerative changes as well as a mushroom sign.  Subscapularis appears to be intact.  The supraspinatus insertion into the humeral head is poorly visualized and likely represents a tear and a retracted tendon here.  Infraspinatus shows some hypoechoic changes in the deep fibers at the insertion of the humeral head but no retraction.  Glenohumeral joint is well-visualized without abnormality. Findings are concerning for a torn and retracted supraspinatus tendon.    PATIENT  SURVEYS:  EVAL: FOTO 51% 07/04/2022: 57% 07/17/2022: 51%  POSTURE: Very minimal rounding through upper back, Rt GH internally rotated.    UPPER EXTREMITY ROM:    Active ROM Right eval Left eval Rt  07/06/22 Rt  07/20/22  Shoulder flexion 156 min pain, pop 160  165  Shoulder extension        Shoulder abduction 152 , min pop 160  165  Shoulder adduction        Shoulder internal rotation WFL mid back  WFL mid back  Mid back equal to R  Mid back   Shoulder external rotation T2 T2 T2 , 60 deg in 90 deg abd  T1  Elbow flexion        Elbow extension        Wrist flexion        Wrist extension        Wrist ulnar deviation        Wrist radial deviation        Wrist pronation        Wrist supination        (Blank rows = not tested)   UPPER EXTREMITY MMT:   MMT Right eval Left eval Right 07/06/2021 Right 07/17/2022 Rt  07/20/22  Shoulder flexion 3/5 4+/5  4/5 pain   4-/5  Shoulder extension         Shoulder abduction 3+/5 4+/5 4/5   4-/5   Shoulder adduction         Shoulder internal rotation 5/5 5/5    5/5  Shoulder external rotation 3/5 4/5  pop 3+/5 3+/5 3+/5  Middle trapezius 4/5       Lower trapezius         Elbow flexion 4+/5 4+/5     Elbow extension         Wrist flexion         Wrist extension         Wrist ulnar deviation         Wrist radial deviation         Wrist pronation         Wrist supination         Grip strength (lbs) 22 34     (Blank rows = not tested)   SHOULDER SPECIAL TESTS: Impingement tests: Painful arc test: negative SLAP lesions:  NT Instability tests:  NT Rotator cuff assessment: Drop arm test: negative, Empty can test: positive , and Full can test: positive  Biceps assessment:  NT   JOINT MOBILITY TESTING:  Normal post capsule bilaterally    PALPATION:  TTP Rt anterior/lateral deltoid              TODAY'S TREATMENT:   [facility] Adult PT Treatment:                                                DATE: 07/10/22 Therapeutic Exercise: Supine Arms 1 red 1 yellow: Arcs in parallel, scaption and triceps Feet in straps 1 red 1 yellow  Arcs, circles and frog squats  Seated arms : blue facing forward serving and lifting x 10  Seated side facing arm External rotation, diagonal pull Rt UE  Long box Pulling straps 1 red spring extension, T press , triceps  Seated roll down 1 red x 8 added row x 10  Goal post  x 10 ,1 red     [facility] Adult PT Treatment:                                                DATE: 07/17/2022 Therapeutic Exercise: UBE L3 x 4 min (2 fwd/bwd) while taking subjective Sidelying thoracic rotation Supine thoracic mobs over FR at various levels Sidelying cross body stretch  Sleeper stretch Supine pec stretch on FR at various ranges Supine serratus press on FR x 15 Supine horizontal abduction with green on FR x 15 Sidelying ER 2 x 10 Prone row with 2# 2 x 10 Tall plank on elevated table  with alternating shoulder taps x 20 Manual Therapy: Skilled palpation and monitoring of muscle tension while performing TPDN treatment Scap pinned shoulder flexion PROM Trigger Point Dry Needling Treatment: Pre-treatment instruction: Patient instructed on dry needling rationale, procedures, and possible side effects including pain during treatment (achy,cramping feeling), bruising, drop of blood, lightheadedness, nausea, sweating. Patient Consent Given: Yes Education handout provided: No Muscles treated: right infraspinatus, rhomboid/mid trap, middle deltoid  Needle size and number: .30x76m x 5 Electrical stimulation performed: No Parameters: N/A Treatment response/outcome: Twitch response elicited and Palpable decrease in muscle tension Post-treatment instructions: Patient instructed to expect possible mild to moderate muscle soreness later today and/or tomorrow. Patient instructed in methods to reduce muscle soreness and to continue prescribed HEP. If patient was dry needled over the lung field, patient was instructed on signs and symptoms of pneumothorax and, however unlikely, to see immediate medical attention should they occur. Patient was also educated on signs and symptoms of infection and to seek medical attention should they occur. Patient verbalized understanding of these instructions and education.   [facility]                                                [facility]: 07/[facility]/22 Therapeutic Exercise: Pilates Reformer used for LE/core strength, postural strength, lumbopelvic disassociation and core control.  Exercises included: Footwork 2 red 1 blue 1 Yellow : heels in parallel and turnout, narrow and wide Supine Arm 1 red 1 yellow: Arcs in parallel, circles and triceps Quadruped 1 red UE press out, added lower body Seated arms ER, yellow spring , fly Bicep curl 1 blue facing back x 10 added hinge for core  Long box Prone 1 red overhead press double arm then single arm               [facility] Adult PT Treatment:                                                DATE: 07/05/22 Therapeutic Exercise: UBE L2 for 6 min  Measure functional reach and ROM  Standing flexion with looped band at wall, full range then partial range  Quadruped cat and camel  Quadruped alt. UE flexion x 8 Quadruped reverse fly 2 lbs x 10 each side  1/2 kneeling row 10 lbs each side  x 15  Prone scapular retraction x 10 , added extension x 10  Sidelying shoulder ER no wgt x 15, a  few with 2 lbs wgt Abd and reverse fly (2 lbs and also green TB )  Modalities: Ionto patch 6 hour patch 1 cc dexamethasone to Rt post cuff    PATIENT EDUCATION: Education details: HEP update, TPDN Person educated: Patient Education method: Explanation, Demonstration, and Handouts Education comprehension: verbalized understanding and returned demonstration   HOME EXERCISE PROGRAM: Access Code: [email or web address]     ASSESSMENT: CLINICAL IMPRESSION: Patient has made functional progress and is experiencing less pain with UE activities, sleeping on her side.  See above for strength and ROM measurements.  She will be seen for a few more weeks until she has her L knee surgery.      OBJECTIVE IMPAIRMENTS: decreased mobility, decreased ROM, decreased strength, increased fascial restrictions, impaired UE functional use, and pain.    ACTIVITY LIMITATIONS: lifting, sleeping, bathing, and reach over head   PARTICIPATION LIMITATIONS: community activity   PERSONAL FACTORS: Past/current experiences and 1 comorbidity: previous Rt UE pain, injury  are also affecting patient's functional outcome.      GOALS: Goals reviewed with patient? Yes   LONG TERM GOALS: Target date: 07/19/2022   Pt will be able to sleep without limitation of pain in Rt UE Baseline:  Goal status: MET     2.  Pt will be able to complete strength training at the gym (group class with modifications) and no increase in shoulder pain.  Baseline: modifies, does home  program and [employer] Goal status: ongoing    3.  Pt will be report no pain at rest or with simple ADLs  Baseline: none today  Goal status: MET   4.  Pt will be able to improve R ER strength 4+/5 or better  Baseline: progress: 3+/5  Goal status: ongoing    5.  FOTO score will improve to 68% or greater as a proxy for improved Rt shoulder function.  Baseline: 55%, 51%  Goal status: ongoing    6.  Pt will I be I HEP for Rt UE strength and mobility Baseline:  Goal status: ongoing     PLAN: PT FREQUENCY: 2x/week   PT DURATION: 6 weeks   PLANNED INTERVENTIONS: Therapeutic exercises, Therapeutic activity, Neuromuscular re-education, Balance training, Gait training, Patient/Family education, Self Care, Joint mobilization, Dry Needling, Cryotherapy, Moist heat, Ionotophoresis '4mg'$ /ml Dexamethasone, Manual therapy, and Re-evaluation   PLAN FOR NEXT SESSION: cont Pilates for UE strength and stability        Raeford Razor, PT 07/20/22 1:35 PM Phone: [phone] Fax: [phone]

## 2022-07-20 ENCOUNTER — Encounter: Payer: Self-pay | Admitting: Physical Therapy

## 2022-07-20 ENCOUNTER — Ambulatory Visit: Payer: PPO | Admitting: Physical Therapy

## 2022-07-20 DIAGNOSIS — M25511 Pain in right shoulder: Secondary | ICD-10-CM | POA: Diagnosis not present

## 2022-07-20 DIAGNOSIS — G8929 Other chronic pain: Secondary | ICD-10-CM

## 2022-07-20 DIAGNOSIS — M6281 Muscle weakness (generalized): Secondary | ICD-10-CM

## 2022-07-23 NOTE — Therapy (Signed)
OUTPATIENT PHYSICAL THERAPY TREATMENT    Patient Name: Sonya Luna MRN: [id number] DOB:1950/10/12, 72 y.o., female 71 Date: 07/24/2022  PCP: Crist Infante, D  REFERRING PROVIDER: Dr. Micheline Chapman    END OF SESSION:   PT End of Session - 07/24/22 0935     Visit Number 12    Number of Visits 19    Date for PT Re-Evaluation 08/17/22    Authorization Type [employer]    PT Start Time 0932    PT Stop Time 1015    PT Time Calculation (min) 43 min    Activity Tolerance Patient tolerated treatment well    Behavior During Therapy WFL for tasks assessed/performed                  Past Medical History:  Diagnosis Date   Bone spur of foot    right foot   Mallet finger    middle finger-right hand   Past Surgical History:  Procedure Laterality Date   FOOT SURGERY Right    bone spur removal   FOOT SURGERY Left    for bone spur   Patient Active Problem List   Diagnosis Date Noted   White coat syndrome with diagnosis of hypertension 05/15/2021   Postmenopausal 05/15/2021   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 08/11/2015   Gastrocnemius muscle strain 06/28/2015   Bicipital tendonitis of right shoulder 05/12/2014   Partial tear of right rotator cuff 05/12/2014   TRIGGER FINGER, LEFT THUMB 07/15/2010   ROTATOR CUFF SYNDROME, RIGHT 12/08/2009   WRIST PAIN, RIGHT 06/23/2009   DE QUERVAIN'S TENOSYNOVITIS 06/23/2009    REFERRING DIAG:  Diagnosis  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  I went to Yoga yesterday.  The range is good right now its just the strength.   PAIN:  Are you having pain? Yes:  NPRS  scale: 2/10 Pain location: Right shoulder   Pain description: sharp, twinge  Aggravating factors: reaching out and back (external rotation, donning a coat) Relieving factors: rest      OBJECTIVE: (objective measures completed at initial evaluation unless otherwise dated)   DIAGNOSTIC FINDINGS:  06/20/22: Biceps tendon was well-visualized in the bicipital groove in both long and short axis.  There may be a split tear in the proximal biceps tendon as it lies in the groove but the majority of the tendon appears to be intact.  AC joint shows some degenerative changes as well as a mushroom sign.  Subscapularis appears to be intact.  The supraspinatus insertion into the humeral head is poorly visualized and likely represents a tear and a retracted tendon here.  Infraspinatus shows some hypoechoic changes in the deep fibers at the insertion of the humeral head but no retraction.  Glenohumeral joint is well-visualized without abnormality. Findings are concerning for a torn and retracted supraspinatus tendon.    PATIENT SURVEYS:  EVAL: FOTO 51% 07/04/2022: 57% 07/17/2022: 51%  POSTURE: Very minimal rounding through upper back, Rt GH internally rotated.    UPPER EXTREMITY ROM:    Active ROM Right eval Left eval Rt  07/06/22 Rt  07/20/22  Shoulder flexion 156 min pain, pop 160  165  Shoulder extension        Shoulder abduction 152 , min pop 160  165  Shoulder adduction        Shoulder internal rotation WFL mid back  WFL mid back  Mid back equal to R  Mid back   Shoulder external rotation T2 T2 T2 , 60 deg in 90 deg abd  T1  Elbow flexion        Elbow extension        Wrist flexion        Wrist extension        Wrist ulnar deviation        Wrist radial deviation        Wrist pronation        Wrist supination        (Blank rows = not tested)   UPPER EXTREMITY MMT:   MMT Right eval Left eval Right 07/06/2021 Right 07/17/2022 Rt  07/20/22  Shoulder flexion 3/5 4+/5 4/5 pain   4-/5   Shoulder extension         Shoulder abduction 3+/5 4+/5 4/5   4-/5   Shoulder adduction         Shoulder internal rotation 5/5 5/5    5/5  Shoulder external rotation 3/5 4/5  pop 3+/5 3+/5 3+/5  Middle trapezius 4/5       Lower trapezius         Elbow flexion 4+/5 4+/5     Elbow extension         Wrist flexion         Wrist extension         Wrist ulnar deviation         Wrist radial deviation         Wrist pronation         Wrist supination         Grip strength (lbs) 22 34     (Blank rows = not tested)   SHOULDER SPECIAL TESTS: Impingement tests: Painful arc test: negative SLAP lesions:  NT Instability tests:  NT Rotator cuff assessment: Drop arm test: negative, Empty can test: positive , and Full can test: positive  Biceps assessment:  NT   JOINT MOBILITY TESTING:  Normal post capsule bilaterally    PALPATION:  TTP Rt anterior/lateral deltoid              TODAY'S TREATMENT:  [facility] Adult PT Treatment:                                                DATE: 07/24/22 Therapeutic Exercise: Pilates Reformer used for LE/core strength, postural strength, lumbopelvic disassociation and core control.  Exercises included: Supine Arm  1 red 1 yellow arcs in flexion, scaption  Supine Abs hundred prep x 10  Quadruped 1 red 1 yellow UE press, added LEs to plank on platform and added short box x 8  Long box Prone pulling straps 1 red x 10 aded triceps x 8 Seated Arms sidefacing ER, flexion, diagonal pull . Needed other hand  to assist concentric phrase  Long box press out (cat)  x 10  Doorway stretch x 3  Rhomboid x 3 on door knob   [facility] Adult PT Treatment:                                                DATE: 07/10/22 Therapeutic Exercise: Supine Arms 1 red 1 yellow: Arcs in parallel, scaption and triceps Feet in straps 1 red 1 yellow  Arcs, circles and frog squats  Seated arms : blue facing forward serving and lifting x 10  Seated side facing arm External rotation, diagonal pull Rt  UE  Long box Pulling straps 1 red spring extension, T press , triceps  Seated roll down 1 red x 8 added row x 10  Goal post x 10 ,1 red     [facility] Adult PT Treatment:                                                DATE: 07/17/2022 Therapeutic Exercise: UBE L3 x 4 min (2 fwd/bwd) while taking subjective Sidelying thoracic rotation Supine thoracic mobs over FR at various levels Sidelying cross body stretch  Sleeper stretch Supine pec stretch on FR at various ranges Supine serratus press on FR x 15 Supine horizontal abduction with green on FR x 15 Sidelying ER 2 x 10 Prone row with 2# 2 x 10 Tall plank on elevated table with alternating shoulder taps x 20 Manual Therapy: Skilled palpation and monitoring of muscle tension while performing TPDN treatment Scap pinned shoulder flexion PROM Trigger Point Dry Needling Treatment: Pre-treatment instruction: Patient instructed on dry needling rationale, procedures, and possible side effects including pain during treatment (achy,cramping feeling), bruising, drop of blood, lightheadedness, nausea, sweating. Patient Consent Given: Yes Education handout provided: No Muscles treated: right infraspinatus, rhomboid/mid trap, middle deltoid  Needle size and number: .30x57m x 5 Electrical stimulation performed: No Parameters: N/A Treatment response/outcome: Twitch response elicited and Palpable decrease in muscle tension Post-treatment instructions: Patient instructed to expect possible mild to moderate muscle soreness later today and/or tomorrow. Patient instructed in methods to reduce muscle soreness and to continue prescribed HEP. If patient was dry needled over the lung field, patient was instructed on signs and symptoms of pneumothorax and, however unlikely, to see immediate medical attention should they occur. Patient was also educated on signs and symptoms of infection and to seek medical attention should they occur. Patient verbalized understanding  of these instructions and education.   [facility]                                                [facility]: 07/[facility]/22 Therapeutic Exercise: Pilates Reformer used for LE/core strength, postural strength, lumbopelvic disassociation and core control.  Exercises included: Footwork 2 red 1 blue 1 Yellow : heels in parallel and turnout, narrow and wide Supine Arm 1 red 1 yellow: Arcs in parallel, circles and triceps Quadruped 1 red UE press out, added lower body Seated arms ER, yellow spring , fly Bicep curl 1 blue facing back x 10 added hinge for  core  Long box Prone 1 red overhead press double arm then single arm              [facility] Adult PT Treatment:                                                DATE: 07/05/22 Therapeutic Exercise: UBE L2 for 6 min  Measure functional reach and ROM  Standing flexion with looped band at wall, full range then partial range  Quadruped cat and camel  Quadruped alt. UE flexion x 8 Quadruped reverse fly 2 lbs x 10 each side  1/2 kneeling row 10 lbs each side  x 15  Prone scapular retraction x 10 , added extension x 10  Sidelying shoulder ER no wgt x 15, a few with 2 lbs wgt Abd and reverse fly (2 lbs and also green TB )  Modalities: Ionto patch 6 hour patch 1 cc dexamethasone to Rt post cuff    PATIENT EDUCATION: Education details: HEP update, TPDN Person educated: Patient Education method: Explanation, Demonstration, and Handouts Education comprehension: verbalized understanding and returned demonstration   HOME EXERCISE PROGRAM: Access Code: [email or web address]     ASSESSMENT: CLINICAL IMPRESSION: Patient continues to exhibit bilateral shoulder weakness especially with external rotation and abduction ranges of motions. Good carryover with Pilates concepts from previous visit.  She will cont to work on shoulder mobility and strength over her trip in the coming week.     OBJECTIVE IMPAIRMENTS: decreased mobility, decreased ROM, decreased strength,  increased fascial restrictions, impaired UE functional use, and pain.    ACTIVITY LIMITATIONS: lifting, sleeping, bathing, and reach over head   PARTICIPATION LIMITATIONS: community activity   PERSONAL FACTORS: Past/current experiences and 1 comorbidity: previous Rt UE pain, injury  are also affecting patient's functional outcome.      GOALS: Goals reviewed with patient? Yes   LONG TERM GOALS: Target date: 07/19/2022   Pt will be able to sleep without limitation of pain in Rt UE Baseline:  Goal status: MET     2.  Pt will be able to complete strength training at the gym (group class with modifications) and no increase in shoulder pain.  Baseline: modifies, does home program and [employer] Goal status: ongoing    3.  Pt will be report no pain at rest or with simple ADLs  Baseline: none today  Goal status: MET   4.  Pt will be able to improve R ER strength 4+/5 or better  Baseline: progress: 3+/5  Goal status: ongoing    5.  FOTO score will improve to 68% or greater as a proxy for improved Rt shoulder function.  Baseline: 55%, 51%  Goal status: ongoing    6.  Pt will I be I HEP for Rt UE strength and mobility Baseline:  Goal status: ongoing     PLAN: PT FREQUENCY: 2x/week   PT DURATION: 6 weeks   PLANNED INTERVENTIONS: Therapeutic exercises, Therapeutic activity, Neuromuscular re-education, Balance training, Gait training, Patient/Family education, Self Care, Joint mobilization, Dry Needling, Cryotherapy, Moist heat, Ionotophoresis '4mg'$ /ml Dexamethasone, Manual therapy, and Re-evaluation   PLAN FOR NEXT SESSION: cont Pilates for UE strength and stability        Raeford Razor, PT 07/24/22 11:13 AM Phone: [phone] Fax: [phone]

## 2022-07-24 ENCOUNTER — Encounter: Payer: Self-pay | Admitting: Physical Therapy

## 2022-07-24 ENCOUNTER — Ambulatory Visit: Payer: PPO | Admitting: Physical Therapy

## 2022-07-24 DIAGNOSIS — M25511 Pain in right shoulder: Secondary | ICD-10-CM | POA: Diagnosis not present

## 2022-07-24 DIAGNOSIS — M6281 Muscle weakness (generalized): Secondary | ICD-10-CM

## 2022-07-24 DIAGNOSIS — G8929 Other chronic pain: Secondary | ICD-10-CM

## 2022-08-01 ENCOUNTER — Encounter: Payer: Self-pay | Admitting: Physical Therapy

## 2022-08-01 ENCOUNTER — Ambulatory Visit: Payer: PPO | Admitting: Physical Therapy

## 2022-08-01 DIAGNOSIS — M6281 Muscle weakness (generalized): Secondary | ICD-10-CM

## 2022-08-01 DIAGNOSIS — M25511 Pain in right shoulder: Secondary | ICD-10-CM | POA: Diagnosis not present

## 2022-08-01 DIAGNOSIS — G8929 Other chronic pain: Secondary | ICD-10-CM

## 2022-08-01 NOTE — Therapy (Signed)
OUTPATIENT PHYSICAL THERAPY TREATMENT    Patient Name: Sonya Luna MRN: [id number] DOB:[birth year], 72 y.o., female 24 Date: 08/01/2022  PCP: Crist Infante, D  REFERRING PROVIDER: Dr. Micheline Chapman    END OF SESSION:   PT End of Session - 08/01/22 0936     Visit Number 13    Number of Visits 19    Date for PT Re-Evaluation 08/17/22    Authorization Type [employer]    PT Start Time 0934    PT Stop Time 1015    PT Time Calculation (min) 41 min    Activity Tolerance Patient tolerated treatment well    Behavior During Therapy WFL for tasks assessed/performed                   Past Medical History:  Diagnosis Date   Bone spur of foot    right foot   Mallet finger    middle finger-right hand   Past Surgical History:  Procedure Laterality Date   FOOT SURGERY Right    bone spur removal   FOOT SURGERY Left    for bone spur   Patient Active Problem List   Diagnosis Date Noted   White coat syndrome with diagnosis of hypertension 05/15/2021   Postmenopausal 05/15/2021   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 08/11/2015   Gastrocnemius muscle strain 06/28/2015   Bicipital tendonitis of right shoulder 05/12/2014   Partial tear of right rotator cuff 05/12/2014   TRIGGER FINGER, LEFT THUMB 07/15/2010   ROTATOR CUFF SYNDROME, RIGHT 12/08/2009   WRIST PAIN, RIGHT 06/23/2009   DE QUERVAIN'S TENOSYNOVITIS 06/23/2009    REFERRING DIAG:  Diagnosis  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  Did band ex only x 1 .  I still feel my upper arms when I lift up.   PAIN:  Are you having pain? Yes: when I do "this" (lifting arms abduction  and IR, elbow bent  NPRS scale: 3/10 Pain location: Right shoulder   Pain description: sharp, twinge  Aggravating factors: reaching out and back (external rotation, donning a coat) Relieving factors: rest      OBJECTIVE: (objective measures completed at initial evaluation unless otherwise dated)   DIAGNOSTIC FINDINGS:  06/20/22: Biceps tendon was well-visualized in the bicipital groove in both long and short axis.  There may be a split tear in the proximal biceps tendon as it lies in the groove but the majority of the tendon appears to be intact.  AC joint shows some degenerative changes as well as a mushroom sign.  Subscapularis appears to be intact.  The supraspinatus insertion into the humeral head is poorly visualized and likely represents a tear and a retracted tendon here.  Infraspinatus shows some hypoechoic changes in the deep fibers at the insertion of the humeral head but no retraction.  Glenohumeral joint is well-visualized without abnormality. Findings are concerning for a torn and  retracted supraspinatus tendon.    PATIENT SURVEYS:  EVAL: FOTO 51% 07/04/2022: 57% 07/17/2022: 51%  POSTURE: Very minimal rounding through upper back, Rt GH internally rotated.    UPPER EXTREMITY ROM:    Active ROM Right eval Left eval Rt  07/06/22 Rt  07/20/22  Shoulder flexion 156 min pain, pop 160  165  Shoulder extension        Shoulder abduction 152 , min pop 160  165  Shoulder adduction        Shoulder internal rotation WFL mid back  WFL mid back  Mid back equal to R  Mid back   Shoulder external rotation T2 T2 T2 , 60 deg in 90 deg abd  T1  Elbow flexion        Elbow extension        Wrist flexion        Wrist extension        Wrist ulnar deviation        Wrist radial deviation        Wrist pronation        Wrist supination        (Blank rows = not tested)   UPPER EXTREMITY MMT:   MMT Right eval Left eval Right 07/06/2021 Right 07/17/2022 Rt  07/20/22  Shoulder flexion 3/5  4+/5 4/5 pain   4-/5  Shoulder extension         Shoulder abduction 3+/5 4+/5 4/5   4-/5   Shoulder adduction         Shoulder internal rotation 5/5 5/5    5/5  Shoulder external rotation 3/5 4/5  pop 3+/5 3+/5 3+/5  Middle trapezius 4/5       Lower trapezius         Elbow flexion 4+/5 4+/5     Elbow extension         Wrist flexion         Wrist extension         Wrist ulnar deviation         Wrist radial deviation         Wrist pronation         Wrist supination         Grip strength (lbs) 22 34     (Blank rows = not tested)   SHOULDER SPECIAL TESTS: Impingement tests: Painful arc test: negative SLAP lesions:  NT Instability tests:  NT Rotator cuff assessment: Drop arm test: negative, Empty can test: positive , and Full can test: positive  Biceps assessment:  NT   JOINT MOBILITY TESTING:  Normal post capsule bilaterally    PALPATION:  TTP Rt anterior/lateral deltoid              TODAY'S TREATMENT: [facility] Adult PT Treatment:                                                DATE: 08/01/22 Therapeutic Exercise: Shoulder mobility with dowel: flexion extension abduction about 30 sec each , palms up and palms down  Green band wall slide with lift off (lower trap)  Prone over ball: extension , T, goal post and dart , 1 lbs x 10 each  Prone cobra x 10 lat pull x 10  Sidelying scaption 2 lbs x 45 sec  Sidelying reverse fly 2 lbs x 45 sec  ER 1 lbs cues for form  Exercises done bilateral for comparison Forearm plank on high mat table/ball  Variations with scapular stability, added FW shift with ankles   Manual Therapy: Brief PROM     [facility] Adult PT Treatment:                                                DATE: 07/24/22 Therapeutic Exercise: Pilates Reformer used for LE/core strength, postural strength, lumbopelvic disassociation and core control.  Exercises included: Supine Arm  1 red 1 yellow arcs in flexion, scaption  Supine Abs hundred prep x 10  Quadruped 1 red 1 yellow UE  press, added LEs to plank on platform and added short box x 8  Long box Prone pulling straps 1 red x 10 aded triceps x 8 Seated Arms sidefacing ER, flexion, diagonal pull . Needed other hand to assist concentric phrase  Long box press out (cat)  x 10  Doorway stretch x 3  Rhomboid x 3 on door knob   [facility] Adult PT Treatment:                                                DATE: 07/10/22 Therapeutic Exercise: Supine Arms 1 red 1 yellow: Arcs in parallel, scaption and triceps Feet in straps 1 red 1 yellow  Arcs, circles and frog squats  Seated arms : blue facing forward serving and lifting x 10  Seated side facing arm External rotation, diagonal pull Rt UE  Long box Pulling straps 1 red spring extension, T press , triceps  Seated roll down 1 red x 8 added row x 10  Goal post x 10 ,1 red     [facility] Adult PT Treatment:                                                DATE: 07/17/2022 Therapeutic Exercise: UBE L3 x 4 min (2 fwd/bwd) while taking subjective Sidelying thoracic rotation Supine thoracic mobs over FR at various levels Sidelying cross body stretch  Sleeper stretch Supine pec stretch on FR at various ranges Supine serratus press on FR x 15 Supine horizontal abduction with green on FR x 15 Sidelying ER 2 x 10 Prone row with 2# 2 x 10 Tall plank on elevated table with alternating shoulder taps x 20 Manual Therapy: Skilled palpation and monitoring of muscle tension while performing TPDN treatment Scap pinned shoulder flexion PROM Trigger Point Dry Needling Treatment: Pre-treatment instruction: Patient instructed on dry needling rationale, procedures, and possible side effects including pain during treatment (achy,cramping feeling), bruising, drop of blood, lightheadedness, nausea, sweating. Patient Consent Given: Yes Education handout provided: No Muscles treated: right infraspinatus, rhomboid/mid trap, middle deltoid  Needle size and number: .30x50m x 5 Electrical stimulation  performed: No Parameters: N/A Treatment response/outcome: Twitch response elicited and Palpable decrease in muscle tension Post-treatment instructions: Patient instructed to expect possible mild to moderate muscle soreness later today and/or tomorrow. Patient instructed in methods to reduce muscle soreness and to continue prescribed HEP. If patient was dry needled over the lung field, patient was  instructed on signs and symptoms of pneumothorax and, however unlikely, to see immediate medical attention should they occur. Patient was also educated on signs and symptoms of infection and to seek medical attention should they occur. Patient verbalized understanding of these instructions and education.   [facility] Adult PT Treatment:                                                DATE: 07/13/22 Therapeutic Exercise: Pilates Reformer used for LE/core strength, postural strength, lumbopelvic disassociation and core control.  Exercises included: Footwork 2 red 1 blue 1 Yellow : heels in parallel and turnout, narrow and wide Supine Arm 1 red 1 yellow: Arcs in parallel, circles and triceps Quadruped 1 red UE press out, added lower body Seated arms ER, yellow spring , fly Bicep curl 1 blue facing back x 10 added hinge for core  Long box Prone 1 red overhead press double arm then single arm              [facility] Adult PT Treatment:                                                DATE: 07/05/22 Therapeutic Exercise: UBE L2 for 6 min  Measure functional reach and ROM  Standing flexion with looped band at wall, full range then partial range  Quadruped cat and camel  Quadruped alt. UE flexion x 8 Quadruped reverse fly 2 lbs x 10 each side  1/2 kneeling row 10 lbs each side  x 15  Prone scapular retraction x 10 , added extension x 10  Sidelying shoulder ER no wgt x 15, a few with 2 lbs wgt Abd and reverse fly (2 lbs and also green TB )  Modalities: Ionto patch 6 hour patch 1 cc dexamethasone to Rt post cuff     PATIENT EDUCATION: Education details: HEP update, TPDN Person educated: Patient Education method: Explanation, Demonstration, and Handouts Education comprehension: verbalized understanding and returned demonstration   HOME EXERCISE PROGRAM: Access Code: [email or web address]     ASSESSMENT: CLINICAL IMPRESSION: Patient continues to be limited in functional shoulder ROM (external rotation).  She has pain in superior shoulder with overhead reaching when in prone, described as pinching in nature. She did very little exercise when she was out of town. She plans to finish POC next week, get knee surgery and put shoulder on hold until knee is less acute.      OBJECTIVE IMPAIRMENTS: decreased mobility, decreased ROM, decreased strength, increased fascial restrictions, impaired UE functional use, and pain.    ACTIVITY LIMITATIONS: lifting, sleeping, bathing, and reach over head   PARTICIPATION LIMITATIONS: community activity   PERSONAL FACTORS: Past/current experiences and 1 comorbidity: previous Rt UE pain, injury  are also affecting patient's functional outcome.      GOALS: Goals reviewed with patient? Yes   LONG TERM GOALS: Target date: 07/19/2022   Pt will be able to sleep without limitation of pain in Rt UE Baseline:  Goal status: MET     2.  Pt will be able to complete strength training at the gym (group class with modifications) and no increase in shoulder pain.  Baseline: modifies, does home program and [employer] Goal status: ongoing  3.  Pt will be report no pain at rest or with simple ADLs  Baseline: none today  Goal status: MET   4.  Pt will be able to improve R ER strength 4+/5 or better  Baseline: progress: 3+/5  Goal status: ongoing    5.  FOTO score will improve to 68% or greater as a proxy for improved Rt shoulder function.  Baseline: 55%, 51%  Goal status: ongoing    6.  Pt will I be I HEP for Rt UE strength and mobility Baseline:  Goal status: ongoing      PLAN: PT FREQUENCY: 2x/week   PT DURATION: 6 weeks   PLANNED INTERVENTIONS: Therapeutic exercises, Therapeutic activity, Neuromuscular re-education, Balance training, Gait training, Patient/Family education, Self Care, Joint mobilization, Dry Needling, Cryotherapy, Moist heat, Ionotophoresis '4mg'$ /ml Dexamethasone, Manual therapy, and Re-evaluation   PLAN FOR NEXT SESSION: cont  UE strength and stability  , pilates       Raeford Razor, PT 08/01/22 9:38 AM Phone: [phone] Fax: [phone]

## 2022-08-03 ENCOUNTER — Ambulatory Visit: Payer: PPO | Admitting: Physical Therapy

## 2022-08-07 ENCOUNTER — Other Ambulatory Visit: Payer: Self-pay

## 2022-08-07 ENCOUNTER — Encounter: Payer: Self-pay | Admitting: Physical Therapy

## 2022-08-07 ENCOUNTER — Ambulatory Visit: Payer: PPO | Attending: Sports Medicine | Admitting: Physical Therapy

## 2022-08-07 DIAGNOSIS — G8929 Other chronic pain: Secondary | ICD-10-CM | POA: Diagnosis not present

## 2022-08-07 DIAGNOSIS — M6281 Muscle weakness (generalized): Secondary | ICD-10-CM | POA: Insufficient documentation

## 2022-08-07 DIAGNOSIS — M25511 Pain in right shoulder: Secondary | ICD-10-CM | POA: Insufficient documentation

## 2022-08-07 NOTE — Therapy (Signed)
OUTPATIENT PHYSICAL THERAPY TREATMENT    Patient Name: Sonya Luna MRN: [id number] DOB:[birth year], 72 y.o., female Today's Date: 08/07/2022  PCP: Crist Infante, D  REFERRING PROVIDER: Dr. Micheline Chapman    END OF SESSION:   PT End of Session - 08/07/22 1621     Visit Number 14    Number of Visits 19    Date for PT Re-Evaluation 08/17/22    Authorization Type [employer]    PT Start Time 1530    PT Stop Time 1615    PT Time Calculation (min) 45 min    Activity Tolerance Patient tolerated treatment well    Behavior During Therapy WFL for tasks assessed/performed                    Past Medical History:  Diagnosis Date   Bone spur of foot    right foot   Mallet finger    middle finger-right hand   Past Surgical History:  Procedure Laterality Date   FOOT SURGERY Right    bone spur removal   FOOT SURGERY Left    for bone spur   Patient Active Problem List   Diagnosis Date Noted   White coat syndrome with diagnosis of hypertension 05/15/2021   Postmenopausal 05/15/2021   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 08/11/2015   Gastrocnemius muscle strain 06/28/2015   Bicipital tendonitis of right shoulder 05/12/2014   Partial tear of right rotator cuff 05/12/2014   TRIGGER FINGER, LEFT THUMB 07/15/2010   ROTATOR CUFF SYNDROME, RIGHT 12/08/2009   WRIST PAIN, RIGHT 06/23/2009   DE QUERVAIN'S TENOSYNOVITIS 06/23/2009    REFERRING DIAG:  Diagnosis  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  PAIN:  Are you having pain? No NPRS scale: 0/10 Pain location: Right shoulder   Pain description: sharp, twinge  Aggravating factors: reaching out and back (external rotation, donning a coat) Relieving factors: rest      OBJECTIVE: (objective measures completed at initial evaluation unless otherwise dated)   DIAGNOSTIC FINDINGS:  06/20/22: Biceps tendon was well-visualized in the bicipital groove in both long and short axis.  There may be a split tear in the proximal biceps tendon as it lies in the groove but the majority of the tendon appears to be intact.  AC joint shows some degenerative changes as well as a mushroom sign.  Subscapularis appears to be intact.  The supraspinatus insertion into the humeral head is poorly visualized and likely represents a tear and a retracted tendon here.  Infraspinatus shows some hypoechoic changes in the deep fibers at the insertion of the humeral head but no retraction.  Glenohumeral joint is well-visualized without abnormality. Findings are concerning for a torn and retracted supraspinatus tendon.  PATIENT SURVEYS:  EVAL: FOTO 51% 07/04/2022: 57% 07/17/2022: 51%  POSTURE: Very minimal rounding through upper back, Rt GH internally rotated.    UPPER EXTREMITY ROM:    Active ROM Right eval Left eval Rt  07/06/22 Rt  07/20/22  Shoulder flexion 156 min pain, pop 160  165  Shoulder extension        Shoulder abduction 152 , min pop 160  165  Shoulder adduction        Shoulder internal rotation WFL mid back  WFL mid back  Mid back equal to R  Mid back   Shoulder external rotation T2 T2 T2 , 60 deg in 90 deg abd  T1  Elbow flexion        Elbow extension        Wrist flexion        Wrist extension        Wrist ulnar deviation        Wrist radial deviation        Wrist pronation        Wrist supination        (Blank rows = not tested)   UPPER EXTREMITY MMT:   MMT Right eval Left eval Right 07/06/2021 Right 07/17/2022 Rt   07/20/22  Shoulder flexion 3/5 4+/5 4/5 pain   4-/5  Shoulder extension         Shoulder abduction 3+/5 4+/5 4/5   4-/5   Shoulder adduction         Shoulder internal rotation 5/5 5/5    5/5  Shoulder external rotation 3/5 4/5  pop 3+/5 3+/5 3+/5  Middle trapezius 4/5       Lower trapezius         Elbow flexion 4+/5 4+/5     Elbow extension         Wrist flexion         Wrist extension         Wrist ulnar deviation         Wrist radial deviation         Wrist pronation         Wrist supination         Grip strength (lbs) 22 34     (Blank rows = not tested)   SHOULDER SPECIAL TESTS: Impingement tests: Painful arc test: negative SLAP lesions:  NT Instability tests:  NT Rotator cuff assessment: Drop arm test: negative, Empty can test: positive , and Full can test: positive  Biceps assessment:  NT   JOINT MOBILITY TESTING:  Normal post capsule bilaterally    PALPATION:  TTP Rt anterior/lateral deltoid              TODAY'S TREATMENT: [facility] Adult PT Treatment:                                                DATE: 08/07/22 Therapeutic Exercise: Sidelying sleep and cross body stretch x 20 sec each Sidelying ER with 1# x 10 Sidelying scaption with 1# x 10 Sidelying reverse fly with 1# x 10 Supine serratus punch 2# 2 x 10 Prone I and T with 1# x 10 each Prone Y x 10 Prone ER at 90 deg x 10 Seated double ER with red loop at wrist 2 x 10 Manual: Skilled palpation and monitoring of muscle tension while performing  TPDN treatment STM right posterior shoulder girdle Trigger Point Dry Needling Treatment: Pre-treatment instruction: Patient instructed on dry needling rationale, procedures, and possible side effects including pain during treatment (achy,cramping feeling), bruising, drop of blood, lightheadedness, nausea, sweating. Patient Consent Given: Yes Education handout provided: No Muscles treated: right infraspinatus and posterior deltoid  Needle size and number: .30x49m x  2 Electrical stimulation performed: No Parameters: N/A Treatment response/outcome: Twitch response elicited and Palpable decrease in muscle tension Post-treatment instructions: Patient instructed to expect possible mild to moderate muscle soreness later today and/or tomorrow. Patient instructed in methods to reduce muscle soreness and to continue prescribed HEP. If patient was dry needled over the lung field, patient was instructed on signs and symptoms of pneumothorax and, however unlikely, to see immediate medical attention should they occur. Patient was also educated on signs and symptoms of infection and to seek medical attention should they occur. Patient verbalized understanding of these instructions and education.   [facility] Treatment:                                                DATE: 08/01/22 Therapeutic Exercise: Shoulder mobility with dowel: flexion extension abduction about 30 sec each , palms up and palms down  Green band wall slide with lift off (lower trap)  Prone over ball: extension , T, goal post and dart , 1 lbs x 10 each  Prone cobra x 10 lat pull x 10  Sidelying scaption 2 lbs x 45 sec  Sidelying reverse fly 2 lbs x 45 sec  ER 1 lbs cues for form  Exercises done bilateral for comparison Forearm plank on high mat table/ball  Variations with scapular stability, added FW shift with ankles   Manual Therapy: Brief PROM    [facility] Adult PT Treatment:                                                DATE: 07/24/22 Therapeutic Exercise: Pilates Reformer used for LE/core strength, postural strength, lumbopelvic disassociation and core control.  Exercises included: Supine Arm  1 red 1 yellow arcs in flexion, scaption  Supine Abs hundred prep x 10  Quadruped 1 red 1 yellow UE press, added LEs to plank on platform and added short box x 8  Long box Prone pulling straps 1 red x 10 aded triceps x 8 Seated Arms sidefacing ER, flexion, diagonal pull . Needed other hand to assist  concentric phrase  Long box press out (cat)  x 10  Doorway stretch x 3  Rhomboid x 3 on door knob   PATIENT EDUCATION: Education details: HEP, TPDN Person educated: Patient Education method: Explanation, Demonstration Education comprehension: verbalized understanding and returned demonstration   HOME EXERCISE PROGRAM: Access Code: [email or web address]    ASSESSMENT: CLINICAL IMPRESSION: Patient tolerated therapy well with no adverse effects. Performed TPDN for right posterior shoulder with good therapeutic benefit. She does continue to exhibit gross strength deficits of right external rotation. Therapy continues to focus primarily on progressing right periscapular and rotator cuff strengthening with good tolerance. She requires occasional cueing for proper scapular control and exercise technique. No changes to HEP. Expect discharge at next visit with upcoming knee surgery.    OBJECTIVE IMPAIRMENTS:  decreased mobility, decreased ROM, decreased strength, increased fascial restrictions, impaired UE functional use, and pain.    ACTIVITY LIMITATIONS: lifting, sleeping, bathing, and reach over head   PARTICIPATION LIMITATIONS: community activity   PERSONAL FACTORS: Past/current experiences and 1 comorbidity: previous Rt UE pain, injury  are also affecting patient's functional outcome.      GOALS: Goals reviewed with patient? Yes   LONG TERM GOALS: Target date: 07/19/2022   Pt will be able to sleep without limitation of pain in Rt UE Baseline:  Goal status: MET     2.  Pt will be able to complete strength training at the gym (group class with modifications) and no increase in shoulder pain.  Baseline: modifies, does home program and [employer] Goal status: ongoing    3.  Pt will be report no pain at rest or with simple ADLs  Baseline: none today  Goal status: MET   4.  Pt will be able to improve R ER strength 4+/5 or better  Baseline: progress: 3+/5  Goal status: ongoing    5.  FOTO score  will improve to 68% or greater as a proxy for improved Rt shoulder function.  Baseline: 55%, 51%  Goal status: ongoing    6.  Pt will I be I HEP for Rt UE strength and mobility Baseline:  Goal status: ongoing     PLAN: PT FREQUENCY: 2x/week   PT DURATION: 6 weeks   PLANNED INTERVENTIONS: Therapeutic exercises, Therapeutic activity, Neuromuscular re-education, Balance training, Gait training, Patient/Family education, Self Care, Joint mobilization, Dry Needling, Cryotherapy, Moist heat, Ionotophoresis '4mg'$ /ml Dexamethasone, Manual therapy, and Re-evaluation   PLAN FOR NEXT SESSION: cont  UE strength and stability  , pilates       Hilda Blades, PT, DPT, LAT, ATC 08/07/22  4:22 PM Phone: [phone] Fax: [phone]

## 2022-08-08 NOTE — Therapy (Unsigned)
OUTPATIENT PHYSICAL THERAPY TREATMENT    Patient Name: Sonya Luna MRN: [id number] DOB:1951-01-28, 72 y.o., female Today's Date: 08/09/2022  PCP: Crist Infante, D  REFERRING PROVIDER: Dr. Micheline Chapman    END OF SESSION:   PT End of Session - 08/09/22 0811     Visit Number 15    Number of Visits 19    Date for PT Re-Evaluation 08/17/22    Authorization Type [employer]    PT Start Time 0800    PT Stop Time 0845    PT Time Calculation (min) 45 min    Activity Tolerance Patient tolerated treatment well    Behavior During Therapy WFL for tasks assessed/performed                     Past Medical History:  Diagnosis Date   Bone spur of foot    right foot   Mallet finger    middle finger-right hand   Past Surgical History:  Procedure Laterality Date   FOOT SURGERY Right    bone spur removal   FOOT SURGERY Left    for bone spur   Patient Active Problem List   Diagnosis Date Noted   White coat syndrome with diagnosis of hypertension 05/15/2021   Postmenopausal 05/15/2021   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 08/11/2015   Gastrocnemius muscle strain 06/28/2015   Bicipital tendonitis of right shoulder 05/12/2014   Partial tear of right rotator cuff 05/12/2014   TRIGGER FINGER, LEFT THUMB 07/15/2010   ROTATOR CUFF SYNDROME, RIGHT 12/08/2009   WRIST PAIN, RIGHT 06/23/2009   DE QUERVAIN'S TENOSYNOVITIS 06/23/2009    REFERRING DIAG:  Diagnosis  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  PAIN:  Are you having pain? No NPRS scale: 0/10 Pain location: Right shoulder   Pain description: sharp, twinge  Aggravating factors: reaching out and back (external rotation, donning a coat) Relieving factors: rest      OBJECTIVE: (objective measures completed at initial evaluation unless otherwise dated)   DIAGNOSTIC FINDINGS:  06/20/22: Biceps tendon was well-visualized in the bicipital groove in both long and short axis.  There may be a split tear in the proximal biceps tendon as it lies in the groove but the majority of the tendon appears to be intact.  AC joint shows some degenerative changes as well as a mushroom sign.  Subscapularis appears to be intact.  The supraspinatus insertion into the humeral head is poorly visualized and likely represents a tear and a retracted tendon here.  Infraspinatus shows some hypoechoic changes in the deep fibers at the insertion of the humeral head but no retraction.  Glenohumeral joint is well-visualized without abnormality. Findings are concerning for a torn and retracted supraspinatus tendon.  PATIENT SURVEYS:  EVAL: FOTO 51% 07/04/2022: 57% 07/17/2022: 51% 08/09/22: 55%  POSTURE: Very minimal rounding through upper back, Rt GH internally rotated.    UPPER EXTREMITY ROM:    Active ROM Right eval Left eval Rt  07/06/22 Rt  07/20/22  Shoulder flexion 156 min pain, pop 160  165  Shoulder extension        Shoulder abduction 152 , min pop 160  165  Shoulder adduction        Shoulder internal rotation WFL mid back  WFL mid back  Mid back equal to R  Mid back   Shoulder external rotation T2 T2 T2 , 60 deg in 90 deg abd  T1   08/09/22: Functional reach equal Rt and Lt (ER and IR), flexion     UPPER EXTREMITY MMT:   MMT Right eval Left eval Right 07/06/2021 Right 07/17/2022 Rt  07/20/22 Rt.  08/09/22  Shoulder flexion 3/5 4+/5 4/5 pain   4-/5 4-/5  Shoulder extension          Shoulder abduction 3+/5 4+/5 4/5   4-/5  4-/5   Shoulder adduction          Shoulder internal rotation 5/5 5/5    5/5 5/5  Shoulder external rotation 3/5 4/5  pop 3+/5 3+/5 3+/5  3+/5  Middle trapezius 4/5        Lower trapezius          Elbow flexion 4+/5 4+/5      Elbow extension          Wrist flexion          Wrist extension          Wrist ulnar deviation          Wrist radial deviation          Wrist pronation          Wrist supination          Grip strength (lbs) 22 34    Rt.33 lbs , Lt. 38 lbs  (Blank rows = not tested)   SHOULDER SPECIAL TESTS: Impingement tests: Painful arc test: negative SLAP lesions:  NT Instability tests:  NT Rotator cuff assessment: Drop arm test: negative, Empty can test: positive , and Full can test: positive  Biceps assessment:  NT   JOINT MOBILITY TESTING:  Normal post capsule bilaterally    PALPATION:  TTP Rt anterior/lateral deltoid              TODAY'S TREATMENT:   [facility] Adult PT Treatment:                                                DATE: 08/09/22 Therapeutic Exercise: UBE 3 min each direction  Quadruped scapular protract/retract double and single arm Bird dog  x 5  ER sidestepping green, yellow modified x 15 and then isolated ER  Side facing ER and flexion x 10  Abduction red band  x 10  Flexion red band  x 10  Plank to push up with black band for assist x 10  Sidelying ER, reverse fly, scaption (1-2 lbs each for about 45 sec each )   Self Care: POC, follow up, HEP, posture and form and post-surgical    [facility] Adult PT Treatment:  DATE: 08/07/22 Therapeutic Exercise: Sidelying sleep and cross body stretch x 20 sec each Sidelying ER with 1# x 10 Sidelying scaption with 1# x 10 Sidelying reverse fly with 1# x 10 Supine serratus punch 2# 2 x 10 Prone I and T with 1# x 10 each Prone Y x 10 Prone ER at 90 deg x 10 Seated double ER with red loop at wrist 2 x 10 Manual: Skilled palpation and monitoring of muscle tension while  performing TPDN treatment STM right posterior shoulder girdle Trigger Point Dry Needling Treatment: Pre-treatment instruction: Patient instructed on dry needling rationale, procedures, and possible side effects including pain during treatment (achy,cramping feeling), bruising, drop of blood, lightheadedness, nausea, sweating. Patient Consent Given: Yes Education handout provided: No Muscles treated: right infraspinatus and posterior deltoid  Needle size and number: .30x66m x 2 Electrical stimulation performed: No Parameters: N/A Treatment response/outcome: Twitch response elicited and Palpable decrease in muscle tension Post-treatment instructions: Patient instructed to expect possible mild to moderate muscle soreness later today and/or tomorrow. Patient instructed in methods to reduce muscle soreness and to continue prescribed HEP. If patient was dry needled over the lung field, patient was instructed on signs and symptoms of pneumothorax and, however unlikely, to see immediate medical attention should they occur. Patient was also educated on signs and symptoms of infection and to seek medical attention should they occur. Patient verbalized understanding of these instructions and education.   [facility]                                                [facility]: 08[facility]22 Therapeutic Exercise: Shoulder mobility with dowel: flexion extension abduction about 30 sec each , palms up and palms down  Green band wall slide with lift off (lower trap)  Prone over ball: extension , T, goal post and dart , 1 lbs x 10 each  Prone cobra x 10 lat pull x 10  Sidelying scaption 2 lbs x 45 sec  Sidelying reverse fly 2 lbs x 45 sec  ER 1 lbs cues for form  Exercises done bilateral for comparison Forearm plank on high mat table/ball  Variations with scapular stability, added FW shift with ankles   Manual Therapy: Brief PROM    [facility] Adult PT Treatment:                                                 DATE: 07/24/22 Therapeutic Exercise: Pilates Reformer used for LE/core strength, postural strength, lumbopelvic disassociation and core control.  Exercises included: Supine Arm  1 red 1 yellow arcs in flexion, scaption  Supine Abs hundred prep x 10  Quadruped 1 red 1 yellow UE press, added LEs to plank on platform and added short box x 8  Long box Prone pulling straps 1 red x 10 aded triceps x 8 Seated Arms sidefacing ER, flexion, diagonal pull . Needed other hand to assist concentric phrase  Long box press out (cat)  x 10  Doorway stretch x 3  Rhomboid x 3 on door knob   PATIENT EDUCATION: Education details: HEP, TPDN Person educated: Patient Education method: Explanation, Demonstration Education comprehension: verbalized understanding and returned demonstration   HOME EXERCISE PROGRAM: Access Code: [email or web address]  ASSESSMENT: CLINICAL IMPRESSION: Patient has met several goals but does continue to have weakness in Rt UE .  She is having knee scope tomorrow.  She may return to PT for her knee and would like to transition to Pilates in the future.     OBJECTIVE IMPAIRMENTS: decreased mobility, decreased ROM, decreased strength, increased fascial restrictions, impaired UE functional use, and pain.    ACTIVITY LIMITATIONS: lifting, sleeping, bathing, and reach over head   PARTICIPATION LIMITATIONS: community activity   PERSONAL FACTORS: Past/current experiences and 1 comorbidity: previous Rt UE pain, injury  are also affecting patient's functional outcome.      GOALS: Goals reviewed with patient? Yes   LONG TERM GOALS: Target date: 07/19/2022   Pt will be able to sleep without limitation of pain in Rt UE Baseline:  Goal status: MET     2.  Pt will be able to complete strength training at the gym (group class with modifications) and no increase in shoulder pain.  Baseline: modifies, does home program and [employer], min pain with 1-2 reps with combo move (shoulder/elbow)  Goal  status: MET    3.  Pt will be report no pain at rest or with simple ADLs  Baseline: none today  Goal status: MET   4.  Pt will be able to improve R ER strength 4+/5 or better  Baseline: progress: 3+/5  Goal status: NOT MET    5.  FOTO score will improve to 68% or greater as a proxy for improved Rt shoulder function.  Baseline: 55%, 51%  Goal status: NOT MET    6.  Pt will I be I HEP for Rt UE strength and mobility Baseline:  Goal status: MET     PLAN: PT FREQUENCY: 2x/week   PT DURATION: 6 weeks   PLANNED INTERVENTIONS: Therapeutic exercises, Therapeutic activity, Neuromuscular re-education, Balance training, Gait training, Patient/Family education, Self Care, Joint mobilization, Dry Needling, Cryotherapy, Moist heat, Ionotophoresis '4mg'$ /ml Dexamethasone, Manual therapy, and Re-evaluation   PLAN FOR NEXT SESSION: NA, DC       Raeford Razor, PT 08/09/22 8:44 AM Phone: [phone] Fax: [phone]

## 2022-08-09 ENCOUNTER — Ambulatory Visit: Payer: PPO | Admitting: Physical Therapy

## 2022-08-09 DIAGNOSIS — M25511 Pain in right shoulder: Secondary | ICD-10-CM | POA: Diagnosis not present

## 2022-08-09 DIAGNOSIS — G8929 Other chronic pain: Secondary | ICD-10-CM

## 2022-08-09 DIAGNOSIS — M6281 Muscle weakness (generalized): Secondary | ICD-10-CM

## 2022-08-10 DIAGNOSIS — G8918 Other acute postprocedural pain: Secondary | ICD-10-CM | POA: Diagnosis not present

## 2022-08-10 DIAGNOSIS — M1712 Unilateral primary osteoarthritis, left knee: Secondary | ICD-10-CM | POA: Diagnosis not present

## 2022-08-10 DIAGNOSIS — S83232A Complex tear of medial meniscus, current injury, left knee, initial encounter: Secondary | ICD-10-CM | POA: Diagnosis not present

## 2022-08-10 DIAGNOSIS — X58XXXA Exposure to other specified factors, initial encounter: Secondary | ICD-10-CM | POA: Diagnosis not present

## 2022-08-10 DIAGNOSIS — M948X6 Other specified disorders of cartilage, lower leg: Secondary | ICD-10-CM | POA: Diagnosis not present

## 2022-08-10 DIAGNOSIS — Y999 Unspecified external cause status: Secondary | ICD-10-CM | POA: Diagnosis not present

## 2022-08-16 DIAGNOSIS — S83232D Complex tear of medial meniscus, current injury, left knee, subsequent encounter: Secondary | ICD-10-CM | POA: Diagnosis not present

## 2022-08-22 DIAGNOSIS — S83232D Complex tear of medial meniscus, current injury, left knee, subsequent encounter: Secondary | ICD-10-CM | POA: Diagnosis not present

## 2022-08-24 DIAGNOSIS — S83232D Complex tear of medial meniscus, current injury, left knee, subsequent encounter: Secondary | ICD-10-CM | POA: Diagnosis not present

## 2022-08-29 DIAGNOSIS — S83232D Complex tear of medial meniscus, current injury, left knee, subsequent encounter: Secondary | ICD-10-CM | POA: Diagnosis not present

## 2022-08-31 DIAGNOSIS — S83232D Complex tear of medial meniscus, current injury, left knee, subsequent encounter: Secondary | ICD-10-CM | POA: Diagnosis not present

## 2022-09-06 DIAGNOSIS — M25562 Pain in left knee: Secondary | ICD-10-CM | POA: Diagnosis not present

## 2022-09-06 DIAGNOSIS — S83232D Complex tear of medial meniscus, current injury, left knee, subsequent encounter: Secondary | ICD-10-CM | POA: Diagnosis not present

## 2022-09-11 DIAGNOSIS — S83232D Complex tear of medial meniscus, current injury, left knee, subsequent encounter: Secondary | ICD-10-CM | POA: Diagnosis not present

## 2022-09-14 DIAGNOSIS — S83232D Complex tear of medial meniscus, current injury, left knee, subsequent encounter: Secondary | ICD-10-CM | POA: Diagnosis not present

## 2022-09-18 DIAGNOSIS — S83232D Complex tear of medial meniscus, current injury, left knee, subsequent encounter: Secondary | ICD-10-CM | POA: Diagnosis not present

## 2022-09-21 DIAGNOSIS — S83232D Complex tear of medial meniscus, current injury, left knee, subsequent encounter: Secondary | ICD-10-CM | POA: Diagnosis not present

## 2022-09-26 DIAGNOSIS — S83232D Complex tear of medial meniscus, current injury, left knee, subsequent encounter: Secondary | ICD-10-CM | POA: Diagnosis not present

## 2022-09-28 DIAGNOSIS — S83232D Complex tear of medial meniscus, current injury, left knee, subsequent encounter: Secondary | ICD-10-CM | POA: Diagnosis not present

## 2022-10-03 DIAGNOSIS — S83232D Complex tear of medial meniscus, current injury, left knee, subsequent encounter: Secondary | ICD-10-CM | POA: Diagnosis not present

## 2022-10-05 DIAGNOSIS — S83232D Complex tear of medial meniscus, current injury, left knee, subsequent encounter: Secondary | ICD-10-CM | POA: Diagnosis not present

## 2022-11-01 DIAGNOSIS — Z8249 Family history of ischemic heart disease and other diseases of the circulatory system: Secondary | ICD-10-CM | POA: Diagnosis not present

## 2022-11-01 DIAGNOSIS — Z008 Encounter for other general examination: Secondary | ICD-10-CM | POA: Diagnosis not present

## 2022-11-01 DIAGNOSIS — R030 Elevated blood-pressure reading, without diagnosis of hypertension: Secondary | ICD-10-CM | POA: Diagnosis not present

## 2022-11-08 DIAGNOSIS — M1712 Unilateral primary osteoarthritis, left knee: Secondary | ICD-10-CM | POA: Diagnosis not present

## 2022-11-13 ENCOUNTER — Other Ambulatory Visit: Payer: Self-pay | Admitting: Obstetrics & Gynecology

## 2022-11-13 DIAGNOSIS — Z0000 Encounter for general adult medical examination without abnormal findings: Secondary | ICD-10-CM

## 2022-11-29 DIAGNOSIS — R69 Illness, unspecified: Secondary | ICD-10-CM | POA: Diagnosis not present

## 2022-12-06 DIAGNOSIS — M25562 Pain in left knee: Secondary | ICD-10-CM | POA: Diagnosis not present

## 2022-12-18 ENCOUNTER — Ambulatory Visit
Admission: RE | Admit: 2022-12-18 | Discharge: 2022-12-18 | Disposition: A | Discharge status: Home / Self Care | Payer: Medicare HMO | Source: Ambulatory Visit | Attending: Obstetrics & Gynecology | Admitting: Obstetrics & Gynecology

## 2022-12-18 DIAGNOSIS — Z1231 Encounter for screening mammogram for malignant neoplasm of breast: Secondary | ICD-10-CM | POA: Diagnosis not present

## 2022-12-18 DIAGNOSIS — Z0000 Encounter for general adult medical examination without abnormal findings: Secondary | ICD-10-CM

## 2022-12-19 DIAGNOSIS — M542 Cervicalgia: Secondary | ICD-10-CM | POA: Diagnosis not present

## 2022-12-19 DIAGNOSIS — M25519 Pain in unspecified shoulder: Secondary | ICD-10-CM | POA: Diagnosis not present

## 2022-12-21 DIAGNOSIS — R69 Illness, unspecified: Secondary | ICD-10-CM | POA: Diagnosis not present

## 2022-12-26 DIAGNOSIS — M25519 Pain in unspecified shoulder: Secondary | ICD-10-CM | POA: Diagnosis not present

## 2022-12-26 DIAGNOSIS — M542 Cervicalgia: Secondary | ICD-10-CM | POA: Diagnosis not present

## 2022-12-28 DIAGNOSIS — M25519 Pain in unspecified shoulder: Secondary | ICD-10-CM | POA: Diagnosis not present

## 2022-12-28 DIAGNOSIS — M542 Cervicalgia: Secondary | ICD-10-CM | POA: Diagnosis not present

## 2023-01-02 DIAGNOSIS — M25519 Pain in unspecified shoulder: Secondary | ICD-10-CM | POA: Diagnosis not present

## 2023-01-02 DIAGNOSIS — M542 Cervicalgia: Secondary | ICD-10-CM | POA: Diagnosis not present

## 2023-01-08 DIAGNOSIS — M25519 Pain in unspecified shoulder: Secondary | ICD-10-CM | POA: Diagnosis not present

## 2023-01-08 DIAGNOSIS — M542 Cervicalgia: Secondary | ICD-10-CM | POA: Diagnosis not present

## 2023-01-10 DIAGNOSIS — M542 Cervicalgia: Secondary | ICD-10-CM | POA: Diagnosis not present

## 2023-01-10 DIAGNOSIS — M25519 Pain in unspecified shoulder: Secondary | ICD-10-CM | POA: Diagnosis not present

## 2023-01-15 DIAGNOSIS — M542 Cervicalgia: Secondary | ICD-10-CM | POA: Diagnosis not present

## 2023-01-15 DIAGNOSIS — M25519 Pain in unspecified shoulder: Secondary | ICD-10-CM | POA: Diagnosis not present

## 2023-01-20 DIAGNOSIS — M25519 Pain in unspecified shoulder: Secondary | ICD-10-CM | POA: Diagnosis not present

## 2023-01-20 DIAGNOSIS — M542 Cervicalgia: Secondary | ICD-10-CM | POA: Diagnosis not present

## 2023-01-23 DIAGNOSIS — M25519 Pain in unspecified shoulder: Secondary | ICD-10-CM | POA: Diagnosis not present

## 2023-01-23 DIAGNOSIS — M542 Cervicalgia: Secondary | ICD-10-CM | POA: Diagnosis not present

## 2023-01-25 DIAGNOSIS — M25519 Pain in unspecified shoulder: Secondary | ICD-10-CM | POA: Diagnosis not present

## 2023-01-25 DIAGNOSIS — M542 Cervicalgia: Secondary | ICD-10-CM | POA: Diagnosis not present

## 2023-01-31 DIAGNOSIS — M25519 Pain in unspecified shoulder: Secondary | ICD-10-CM | POA: Diagnosis not present

## 2023-01-31 DIAGNOSIS — M542 Cervicalgia: Secondary | ICD-10-CM | POA: Diagnosis not present

## 2023-02-01 DIAGNOSIS — H524 Presbyopia: Secondary | ICD-10-CM | POA: Diagnosis not present

## 2023-02-02 DIAGNOSIS — M25519 Pain in unspecified shoulder: Secondary | ICD-10-CM | POA: Diagnosis not present

## 2023-02-02 DIAGNOSIS — M542 Cervicalgia: Secondary | ICD-10-CM | POA: Diagnosis not present

## 2023-02-08 DIAGNOSIS — M25519 Pain in unspecified shoulder: Secondary | ICD-10-CM | POA: Diagnosis not present

## 2023-02-08 DIAGNOSIS — M542 Cervicalgia: Secondary | ICD-10-CM | POA: Diagnosis not present

## 2023-02-12 DIAGNOSIS — M25519 Pain in unspecified shoulder: Secondary | ICD-10-CM | POA: Diagnosis not present

## 2023-02-12 DIAGNOSIS — M542 Cervicalgia: Secondary | ICD-10-CM | POA: Diagnosis not present

## 2023-02-14 DIAGNOSIS — M542 Cervicalgia: Secondary | ICD-10-CM | POA: Diagnosis not present

## 2023-02-14 DIAGNOSIS — M25519 Pain in unspecified shoulder: Secondary | ICD-10-CM | POA: Diagnosis not present

## 2023-02-20 DIAGNOSIS — R69 Illness, unspecified: Secondary | ICD-10-CM | POA: Diagnosis not present

## 2023-02-26 DIAGNOSIS — M542 Cervicalgia: Secondary | ICD-10-CM | POA: Diagnosis not present

## 2023-02-26 DIAGNOSIS — M25519 Pain in unspecified shoulder: Secondary | ICD-10-CM | POA: Diagnosis not present

## 2023-02-28 DIAGNOSIS — M25519 Pain in unspecified shoulder: Secondary | ICD-10-CM | POA: Diagnosis not present

## 2023-02-28 DIAGNOSIS — M542 Cervicalgia: Secondary | ICD-10-CM | POA: Diagnosis not present

## 2023-03-06 DIAGNOSIS — M542 Cervicalgia: Secondary | ICD-10-CM | POA: Diagnosis not present

## 2023-03-06 DIAGNOSIS — M25519 Pain in unspecified shoulder: Secondary | ICD-10-CM | POA: Diagnosis not present

## 2023-03-12 DIAGNOSIS — M542 Cervicalgia: Secondary | ICD-10-CM | POA: Diagnosis not present

## 2023-03-12 DIAGNOSIS — M25519 Pain in unspecified shoulder: Secondary | ICD-10-CM | POA: Diagnosis not present

## 2023-03-20 DIAGNOSIS — R7989 Other specified abnormal findings of blood chemistry: Secondary | ICD-10-CM | POA: Diagnosis not present

## 2023-03-20 DIAGNOSIS — M542 Cervicalgia: Secondary | ICD-10-CM | POA: Diagnosis not present

## 2023-03-20 DIAGNOSIS — M25519 Pain in unspecified shoulder: Secondary | ICD-10-CM | POA: Diagnosis not present

## 2023-03-20 DIAGNOSIS — E785 Hyperlipidemia, unspecified: Secondary | ICD-10-CM | POA: Diagnosis not present

## 2023-03-20 DIAGNOSIS — Z1212 Encounter for screening for malignant neoplasm of rectum: Secondary | ICD-10-CM | POA: Diagnosis not present

## 2023-03-20 DIAGNOSIS — R7301 Impaired fasting glucose: Secondary | ICD-10-CM | POA: Diagnosis not present

## 2023-03-26 DIAGNOSIS — Z1212 Encounter for screening for malignant neoplasm of rectum: Secondary | ICD-10-CM | POA: Diagnosis not present

## 2023-03-26 DIAGNOSIS — M542 Cervicalgia: Secondary | ICD-10-CM | POA: Diagnosis not present

## 2023-03-26 DIAGNOSIS — M25519 Pain in unspecified shoulder: Secondary | ICD-10-CM | POA: Diagnosis not present

## 2023-03-26 DIAGNOSIS — E785 Hyperlipidemia, unspecified: Secondary | ICD-10-CM | POA: Diagnosis not present

## 2023-03-27 DIAGNOSIS — M7660 Achilles tendinitis, unspecified leg: Secondary | ICD-10-CM | POA: Diagnosis not present

## 2023-03-27 DIAGNOSIS — Z780 Asymptomatic menopausal state: Secondary | ICD-10-CM | POA: Diagnosis not present

## 2023-03-27 DIAGNOSIS — R030 Elevated blood-pressure reading, without diagnosis of hypertension: Secondary | ICD-10-CM | POA: Diagnosis not present

## 2023-03-27 DIAGNOSIS — E785 Hyperlipidemia, unspecified: Secondary | ICD-10-CM | POA: Diagnosis not present

## 2023-03-27 DIAGNOSIS — Z23 Encounter for immunization: Secondary | ICD-10-CM | POA: Diagnosis not present

## 2023-03-27 DIAGNOSIS — M25511 Pain in right shoulder: Secondary | ICD-10-CM | POA: Diagnosis not present

## 2023-03-27 DIAGNOSIS — M2010 Hallux valgus (acquired), unspecified foot: Secondary | ICD-10-CM | POA: Diagnosis not present

## 2023-03-27 DIAGNOSIS — Z0000 Encounter for general adult medical examination without abnormal findings: Secondary | ICD-10-CM | POA: Diagnosis not present

## 2023-03-27 DIAGNOSIS — R82998 Other abnormal findings in urine: Secondary | ICD-10-CM | POA: Diagnosis not present

## 2023-03-27 DIAGNOSIS — M179 Osteoarthritis of knee, unspecified: Secondary | ICD-10-CM | POA: Diagnosis not present

## 2023-03-27 DIAGNOSIS — M542 Cervicalgia: Secondary | ICD-10-CM | POA: Diagnosis not present

## 2023-03-27 DIAGNOSIS — M8580 Other specified disorders of bone density and structure, unspecified site: Secondary | ICD-10-CM | POA: Diagnosis not present

## 2023-03-27 DIAGNOSIS — R7301 Impaired fasting glucose: Secondary | ICD-10-CM | POA: Diagnosis not present

## 2023-04-12 DIAGNOSIS — M25519 Pain in unspecified shoulder: Secondary | ICD-10-CM | POA: Diagnosis not present

## 2023-04-12 DIAGNOSIS — M5384 Other specified dorsopathies, thoracic region: Secondary | ICD-10-CM | POA: Diagnosis not present

## 2023-04-12 DIAGNOSIS — M542 Cervicalgia: Secondary | ICD-10-CM | POA: Diagnosis not present

## 2023-04-12 DIAGNOSIS — M50322 Other cervical disc degeneration at C5-C6 level: Secondary | ICD-10-CM | POA: Diagnosis not present

## 2023-04-12 DIAGNOSIS — M9901 Segmental and somatic dysfunction of cervical region: Secondary | ICD-10-CM | POA: Diagnosis not present

## 2023-04-12 DIAGNOSIS — M9902 Segmental and somatic dysfunction of thoracic region: Secondary | ICD-10-CM | POA: Diagnosis not present

## 2023-04-16 DIAGNOSIS — M25519 Pain in unspecified shoulder: Secondary | ICD-10-CM | POA: Diagnosis not present

## 2023-04-16 DIAGNOSIS — M542 Cervicalgia: Secondary | ICD-10-CM | POA: Diagnosis not present

## 2023-04-17 DIAGNOSIS — M25562 Pain in left knee: Secondary | ICD-10-CM | POA: Diagnosis not present

## 2023-05-07 DIAGNOSIS — M542 Cervicalgia: Secondary | ICD-10-CM | POA: Diagnosis not present

## 2023-05-07 DIAGNOSIS — M25519 Pain in unspecified shoulder: Secondary | ICD-10-CM | POA: Diagnosis not present

## 2023-05-14 ENCOUNTER — Other Ambulatory Visit (HOSPITAL_COMMUNITY)
Admission: RE | Admit: 2023-05-14 | Discharge: 2023-05-14 | Disposition: A | Discharge status: Home / Self Care | Payer: Medicare HMO | Source: Ambulatory Visit | Attending: Obstetrics & Gynecology | Admitting: Obstetrics & Gynecology

## 2023-05-14 ENCOUNTER — Encounter (HOSPITAL_BASED_OUTPATIENT_CLINIC_OR_DEPARTMENT_OTHER): Payer: Self-pay | Admitting: Obstetrics & Gynecology

## 2023-05-14 ENCOUNTER — Ambulatory Visit (HOSPITAL_BASED_OUTPATIENT_CLINIC_OR_DEPARTMENT_OTHER): Payer: Medicare HMO | Admitting: Obstetrics & Gynecology

## 2023-05-14 VITALS — BP 148/92 | HR 62 | Ht 66.5 in | Wt 133.0 lb

## 2023-05-14 DIAGNOSIS — M85852 Other specified disorders of bone density and structure, left thigh: Secondary | ICD-10-CM | POA: Diagnosis not present

## 2023-05-14 DIAGNOSIS — M85851 Other specified disorders of bone density and structure, right thigh: Secondary | ICD-10-CM | POA: Diagnosis not present

## 2023-05-14 DIAGNOSIS — D224 Melanocytic nevi of scalp and neck: Secondary | ICD-10-CM | POA: Diagnosis not present

## 2023-05-14 DIAGNOSIS — L821 Other seborrheic keratosis: Secondary | ICD-10-CM | POA: Diagnosis not present

## 2023-05-14 DIAGNOSIS — D225 Melanocytic nevi of trunk: Secondary | ICD-10-CM | POA: Diagnosis not present

## 2023-05-14 DIAGNOSIS — Z124 Encounter for screening for malignant neoplasm of cervix: Secondary | ICD-10-CM

## 2023-05-14 DIAGNOSIS — Z780 Asymptomatic menopausal state: Secondary | ICD-10-CM

## 2023-05-14 DIAGNOSIS — E2839 Other primary ovarian failure: Secondary | ICD-10-CM | POA: Diagnosis not present

## 2023-05-14 DIAGNOSIS — I10 Essential (primary) hypertension: Secondary | ICD-10-CM | POA: Diagnosis not present

## 2023-05-14 DIAGNOSIS — L819 Disorder of pigmentation, unspecified: Secondary | ICD-10-CM | POA: Diagnosis not present

## 2023-05-14 DIAGNOSIS — Z01419 Encounter for gynecological examination (general) (routine) without abnormal findings: Secondary | ICD-10-CM | POA: Diagnosis not present

## 2023-05-14 DIAGNOSIS — D2271 Melanocytic nevi of right lower limb, including hip: Secondary | ICD-10-CM | POA: Diagnosis not present

## 2023-05-14 DIAGNOSIS — L218 Other seborrheic dermatitis: Secondary | ICD-10-CM | POA: Diagnosis not present

## 2023-05-14 NOTE — Progress Notes (Signed)
72 y.o. G60P4 Married White or Caucasian female here for breast and pelvic exam.  I am also following her for breast and pelvic exam.  She is PMP and not on HRT.  Denies vaginal bleeding.  Biggest issues this year has been knee surgery.  Reports there more arthritis than expected by imaging.  Her knee isn't really any better.  She isn't in a lot of pain.  Wants to talk about what I think about this.  Hasn't had a BMD in a few years.  Will order this.  Fall prevention discussed.     Patient's last menstrual period was 07/03/2002.          Sexually active: Yes.    H/O STD:  no  Health Maintenance: PCP:  Dr. Waynard Edwards.  Last wellness appt was 03/2023.  Did blood work at that appt:  yes Vaccines are up to date:  pt feels certain her pneumonia vaccination has been completed Colonoscopy:  12/03/2013 MMG:  12/18/2022 Neg BMD:  05/10/2015 Last pap smear:  05/12/2021 Negative.      reports that she has never smoked. She has never used smokeless tobacco. She reports current alcohol use of about 6.0 - 8.0 standard drinks of alcohol per week. She reports that she does not use drugs.  Past Medical History:  Diagnosis Date   Arthritis of knee, left    Bone spur of foot    right foot   Mallet finger    middle finger-right hand   White coat syndrome with diagnosis of hypertension     Past Surgical History:  Procedure Laterality Date   FOOT SURGERY Right    bone spur removal   FOOT SURGERY Left    for bone spur   KNEE ARTHROSCOPY Left 2024   Dr. Yisroel Ramming    Current Outpatient Medications  Medication Sig Dispense Refill   Multiple Vitamin (MULTIVITAMIN) tablet Take 1 tablet by mouth daily.     rosuvastatin (CRESTOR) 5 MG tablet Take 5 mg by mouth daily.     zolpidem (AMBIEN) 5 MG tablet Take 1 tablet by mouth as needed.     No current facility-administered medications for this visit.    Family History  Problem Relation Age of Onset   Diabetes Maternal Grandfather    Cervical cancer  Paternal Grandmother    Heart attack Father        smoker-had heart surgery   Breast cancer Neg Hx     Review of Systems  Constitutional: Negative.   Genitourinary: Negative.     Exam:   BP (!) 148/92 (BP Location: Right Arm, Patient Position: Sitting, Cuff Size: Large) Comment: manual  Pulse 62   Ht 5' 6.5" (1.689 m)   Wt 133 lb (60.3 kg)   LMP 07/03/2002   BMI 21.15 kg/m   Height: 5' 6.5" (168.9 cm)  General appearance: alert, cooperative and appears stated age Breasts: normal appearance, no masses or tenderness Abdomen: soft, non-tender; bowel sounds normal; no masses,  no organomegaly Lymph nodes: Cervical, supraclavicular, and axillary nodes normal.  No abnormal inguinal nodes palpated Neurologic: Grossly normal  Pelvic: External genitalia:  no lesions              Urethra:  normal appearing urethra with no masses, tenderness or lesions              Bartholins and Skenes: normal                 Vagina: normal appearing vagina with  atrophic changes and no discharge, no lesions              Cervix: no lesions              Pap taken: Yes.   Bimanual Exam:  Uterus:  normal size, contour, position, consistency, mobility, non-tender              Adnexa: normal adnexa and no mass, fullness, tenderness               Rectovaginal: Confirms               Anus:  normal sphincter tone, no lesions  Chaperone, Ina Homes, CMA, was present for exam.  Assessment/Plan: 1. Encounter for well woman exam with routine gynecological exam - Pap smear obtained today - Mammogram 12/2022 - Colonoscopy 12/2013 - Bone mineral density ordered to do with mammogram next - lab work done with PCP, Dr. Waynard Edwards - vaccines reviewed/updated  2. Postmenopausal  3. White coat syndrome with diagnosis of hypertension - has done evaluation x 2 with Dr. Waynard Edwards for this and both times everything was normal.  Repeat check today was improved.  4. Osteopenia of necks of both femurs - DG BONE DENSITY  (DXA); Future  5. Hypoestrogenism

## 2023-05-15 DIAGNOSIS — M542 Cervicalgia: Secondary | ICD-10-CM | POA: Diagnosis not present

## 2023-05-15 DIAGNOSIS — M25519 Pain in unspecified shoulder: Secondary | ICD-10-CM | POA: Diagnosis not present

## 2023-05-16 LAB — CYTOLOGY - PAP: Diagnosis: NEGATIVE

## 2023-05-24 DIAGNOSIS — M25562 Pain in left knee: Secondary | ICD-10-CM | POA: Diagnosis not present

## 2023-06-05 DIAGNOSIS — M542 Cervicalgia: Secondary | ICD-10-CM | POA: Diagnosis not present

## 2023-06-05 DIAGNOSIS — M25519 Pain in unspecified shoulder: Secondary | ICD-10-CM | POA: Diagnosis not present

## 2023-06-15 DIAGNOSIS — M25519 Pain in unspecified shoulder: Secondary | ICD-10-CM | POA: Diagnosis not present

## 2023-06-15 DIAGNOSIS — M542 Cervicalgia: Secondary | ICD-10-CM | POA: Diagnosis not present

## 2023-06-19 DIAGNOSIS — M25519 Pain in unspecified shoulder: Secondary | ICD-10-CM | POA: Diagnosis not present

## 2023-06-19 DIAGNOSIS — M542 Cervicalgia: Secondary | ICD-10-CM | POA: Diagnosis not present

## 2023-07-11 DIAGNOSIS — M25519 Pain in unspecified shoulder: Secondary | ICD-10-CM | POA: Diagnosis not present

## 2023-07-11 DIAGNOSIS — M542 Cervicalgia: Secondary | ICD-10-CM | POA: Diagnosis not present

## 2023-07-16 DIAGNOSIS — M25519 Pain in unspecified shoulder: Secondary | ICD-10-CM | POA: Diagnosis not present

## 2023-07-16 DIAGNOSIS — M542 Cervicalgia: Secondary | ICD-10-CM | POA: Diagnosis not present

## 2023-07-30 DIAGNOSIS — M542 Cervicalgia: Secondary | ICD-10-CM | POA: Diagnosis not present

## 2023-07-30 DIAGNOSIS — M25519 Pain in unspecified shoulder: Secondary | ICD-10-CM | POA: Diagnosis not present

## 2023-08-13 DIAGNOSIS — M542 Cervicalgia: Secondary | ICD-10-CM | POA: Diagnosis not present

## 2023-08-13 DIAGNOSIS — M25519 Pain in unspecified shoulder: Secondary | ICD-10-CM | POA: Diagnosis not present

## 2023-08-20 DIAGNOSIS — M25519 Pain in unspecified shoulder: Secondary | ICD-10-CM | POA: Diagnosis not present

## 2023-08-20 DIAGNOSIS — M542 Cervicalgia: Secondary | ICD-10-CM | POA: Diagnosis not present

## 2023-09-03 DIAGNOSIS — M25519 Pain in unspecified shoulder: Secondary | ICD-10-CM | POA: Diagnosis not present

## 2023-09-03 DIAGNOSIS — M542 Cervicalgia: Secondary | ICD-10-CM | POA: Diagnosis not present

## 2023-09-12 DIAGNOSIS — M542 Cervicalgia: Secondary | ICD-10-CM | POA: Diagnosis not present

## 2023-09-12 DIAGNOSIS — M25519 Pain in unspecified shoulder: Secondary | ICD-10-CM | POA: Diagnosis not present

## 2023-09-19 DIAGNOSIS — M25519 Pain in unspecified shoulder: Secondary | ICD-10-CM | POA: Diagnosis not present

## 2023-09-19 DIAGNOSIS — M542 Cervicalgia: Secondary | ICD-10-CM | POA: Diagnosis not present

## 2023-09-26 DIAGNOSIS — M25519 Pain in unspecified shoulder: Secondary | ICD-10-CM | POA: Diagnosis not present

## 2023-09-26 DIAGNOSIS — M542 Cervicalgia: Secondary | ICD-10-CM | POA: Diagnosis not present

## 2023-10-02 DIAGNOSIS — M25562 Pain in left knee: Secondary | ICD-10-CM | POA: Diagnosis not present

## 2023-10-03 DIAGNOSIS — M25519 Pain in unspecified shoulder: Secondary | ICD-10-CM | POA: Diagnosis not present

## 2023-10-03 DIAGNOSIS — M542 Cervicalgia: Secondary | ICD-10-CM | POA: Diagnosis not present

## 2023-10-16 ENCOUNTER — Other Ambulatory Visit (HOSPITAL_BASED_OUTPATIENT_CLINIC_OR_DEPARTMENT_OTHER): Payer: Self-pay | Admitting: Obstetrics & Gynecology

## 2023-10-16 DIAGNOSIS — M85851 Other specified disorders of bone density and structure, right thigh: Secondary | ICD-10-CM

## 2023-10-16 DIAGNOSIS — E2839 Other primary ovarian failure: Secondary | ICD-10-CM

## 2023-10-24 DIAGNOSIS — M542 Cervicalgia: Secondary | ICD-10-CM | POA: Diagnosis not present

## 2023-10-24 DIAGNOSIS — M25519 Pain in unspecified shoulder: Secondary | ICD-10-CM | POA: Diagnosis not present

## 2023-10-30 DIAGNOSIS — M25562 Pain in left knee: Secondary | ICD-10-CM | POA: Diagnosis not present

## 2023-10-31 DIAGNOSIS — M542 Cervicalgia: Secondary | ICD-10-CM | POA: Diagnosis not present

## 2023-10-31 DIAGNOSIS — M25519 Pain in unspecified shoulder: Secondary | ICD-10-CM | POA: Diagnosis not present

## 2023-11-12 DIAGNOSIS — M542 Cervicalgia: Secondary | ICD-10-CM | POA: Diagnosis not present

## 2023-11-12 DIAGNOSIS — M25519 Pain in unspecified shoulder: Secondary | ICD-10-CM | POA: Diagnosis not present

## 2023-11-19 ENCOUNTER — Other Ambulatory Visit: Payer: Self-pay | Admitting: Obstetrics & Gynecology

## 2023-11-19 ENCOUNTER — Other Ambulatory Visit: Payer: Self-pay | Admitting: Internal Medicine

## 2023-11-19 DIAGNOSIS — M25519 Pain in unspecified shoulder: Secondary | ICD-10-CM | POA: Diagnosis not present

## 2023-11-19 DIAGNOSIS — Z0000 Encounter for general adult medical examination without abnormal findings: Secondary | ICD-10-CM

## 2023-11-19 DIAGNOSIS — M542 Cervicalgia: Secondary | ICD-10-CM | POA: Diagnosis not present

## 2023-12-12 DIAGNOSIS — M25519 Pain in unspecified shoulder: Secondary | ICD-10-CM | POA: Diagnosis not present

## 2023-12-12 DIAGNOSIS — M542 Cervicalgia: Secondary | ICD-10-CM | POA: Diagnosis not present

## 2023-12-17 DIAGNOSIS — K635 Polyp of colon: Secondary | ICD-10-CM | POA: Diagnosis not present

## 2023-12-17 DIAGNOSIS — Z1211 Encounter for screening for malignant neoplasm of colon: Secondary | ICD-10-CM | POA: Diagnosis not present

## 2023-12-17 DIAGNOSIS — K5730 Diverticulosis of large intestine without perforation or abscess without bleeding: Secondary | ICD-10-CM | POA: Diagnosis not present

## 2023-12-17 DIAGNOSIS — K648 Other hemorrhoids: Secondary | ICD-10-CM | POA: Diagnosis not present

## 2023-12-19 DIAGNOSIS — M542 Cervicalgia: Secondary | ICD-10-CM | POA: Diagnosis not present

## 2023-12-19 DIAGNOSIS — K635 Polyp of colon: Secondary | ICD-10-CM | POA: Diagnosis not present

## 2023-12-19 DIAGNOSIS — M25519 Pain in unspecified shoulder: Secondary | ICD-10-CM | POA: Diagnosis not present

## 2023-12-20 ENCOUNTER — Ambulatory Visit
Admission: RE | Admit: 2023-12-20 | Discharge: 2023-12-20 | Disposition: A | Discharge status: Home / Self Care | Payer: Self-pay | Source: Ambulatory Visit | Attending: Internal Medicine | Admitting: Internal Medicine

## 2023-12-20 DIAGNOSIS — Z1231 Encounter for screening mammogram for malignant neoplasm of breast: Secondary | ICD-10-CM | POA: Diagnosis not present

## 2023-12-20 DIAGNOSIS — Z0000 Encounter for general adult medical examination without abnormal findings: Secondary | ICD-10-CM

## 2023-12-26 DIAGNOSIS — M25519 Pain in unspecified shoulder: Secondary | ICD-10-CM | POA: Diagnosis not present

## 2023-12-26 DIAGNOSIS — M542 Cervicalgia: Secondary | ICD-10-CM | POA: Diagnosis not present

## 2024-01-01 DIAGNOSIS — M25562 Pain in left knee: Secondary | ICD-10-CM | POA: Diagnosis not present

## 2024-01-01 DIAGNOSIS — M542 Cervicalgia: Secondary | ICD-10-CM | POA: Diagnosis not present

## 2024-01-01 DIAGNOSIS — M1712 Unilateral primary osteoarthritis, left knee: Secondary | ICD-10-CM | POA: Diagnosis not present

## 2024-01-01 DIAGNOSIS — M25519 Pain in unspecified shoulder: Secondary | ICD-10-CM | POA: Diagnosis not present

## 2024-01-09 DIAGNOSIS — M542 Cervicalgia: Secondary | ICD-10-CM | POA: Diagnosis not present

## 2024-01-09 DIAGNOSIS — M25519 Pain in unspecified shoulder: Secondary | ICD-10-CM | POA: Diagnosis not present

## 2024-01-10 DIAGNOSIS — M25562 Pain in left knee: Secondary | ICD-10-CM | POA: Diagnosis not present

## 2024-01-14 DIAGNOSIS — M542 Cervicalgia: Secondary | ICD-10-CM | POA: Diagnosis not present

## 2024-01-14 DIAGNOSIS — M25519 Pain in unspecified shoulder: Secondary | ICD-10-CM | POA: Diagnosis not present

## 2024-01-21 DIAGNOSIS — M25519 Pain in unspecified shoulder: Secondary | ICD-10-CM | POA: Diagnosis not present

## 2024-01-21 DIAGNOSIS — M542 Cervicalgia: Secondary | ICD-10-CM | POA: Diagnosis not present

## 2024-01-23 DIAGNOSIS — M25519 Pain in unspecified shoulder: Secondary | ICD-10-CM | POA: Diagnosis not present

## 2024-01-23 DIAGNOSIS — M542 Cervicalgia: Secondary | ICD-10-CM | POA: Diagnosis not present

## 2024-02-01 DIAGNOSIS — M1712 Unilateral primary osteoarthritis, left knee: Secondary | ICD-10-CM | POA: Diagnosis not present

## 2024-02-01 DIAGNOSIS — M25762 Osteophyte, left knee: Secondary | ICD-10-CM | POA: Diagnosis not present

## 2024-02-01 DIAGNOSIS — G8918 Other acute postprocedural pain: Secondary | ICD-10-CM | POA: Diagnosis not present

## 2024-02-04 DIAGNOSIS — R269 Unspecified abnormalities of gait and mobility: Secondary | ICD-10-CM | POA: Diagnosis not present

## 2024-02-04 DIAGNOSIS — Z96652 Presence of left artificial knee joint: Secondary | ICD-10-CM | POA: Diagnosis not present

## 2024-02-04 DIAGNOSIS — M6281 Muscle weakness (generalized): Secondary | ICD-10-CM | POA: Diagnosis not present

## 2024-02-04 DIAGNOSIS — M1712 Unilateral primary osteoarthritis, left knee: Secondary | ICD-10-CM | POA: Diagnosis not present

## 2024-02-04 DIAGNOSIS — M25662 Stiffness of left knee, not elsewhere classified: Secondary | ICD-10-CM | POA: Diagnosis not present

## 2024-02-06 DIAGNOSIS — R269 Unspecified abnormalities of gait and mobility: Secondary | ICD-10-CM | POA: Diagnosis not present

## 2024-02-06 DIAGNOSIS — Z96652 Presence of left artificial knee joint: Secondary | ICD-10-CM | POA: Diagnosis not present

## 2024-02-06 DIAGNOSIS — M6281 Muscle weakness (generalized): Secondary | ICD-10-CM | POA: Diagnosis not present

## 2024-02-06 DIAGNOSIS — M1712 Unilateral primary osteoarthritis, left knee: Secondary | ICD-10-CM | POA: Diagnosis not present

## 2024-02-06 DIAGNOSIS — M25662 Stiffness of left knee, not elsewhere classified: Secondary | ICD-10-CM | POA: Diagnosis not present

## 2024-02-09 ENCOUNTER — Emergency Department (HOSPITAL_BASED_OUTPATIENT_CLINIC_OR_DEPARTMENT_OTHER)
Admission: EM | Admit: 2024-02-09 | Discharge: 2024-02-09 | Disposition: A | Discharge status: Home / Self Care | Attending: Emergency Medicine | Admitting: Emergency Medicine

## 2024-02-09 ENCOUNTER — Encounter (HOSPITAL_BASED_OUTPATIENT_CLINIC_OR_DEPARTMENT_OTHER): Payer: Self-pay

## 2024-02-09 ENCOUNTER — Emergency Department (HOSPITAL_BASED_OUTPATIENT_CLINIC_OR_DEPARTMENT_OTHER)

## 2024-02-09 ENCOUNTER — Other Ambulatory Visit: Payer: Self-pay

## 2024-02-09 DIAGNOSIS — I4819 Other persistent atrial fibrillation: Secondary | ICD-10-CM | POA: Diagnosis not present

## 2024-02-09 DIAGNOSIS — R918 Other nonspecific abnormal finding of lung field: Secondary | ICD-10-CM | POA: Diagnosis not present

## 2024-02-09 DIAGNOSIS — G459 Transient cerebral ischemic attack, unspecified: Secondary | ICD-10-CM | POA: Diagnosis present

## 2024-02-09 DIAGNOSIS — R079 Chest pain, unspecified: Secondary | ICD-10-CM | POA: Insufficient documentation

## 2024-02-09 DIAGNOSIS — E785 Hyperlipidemia, unspecified: Secondary | ICD-10-CM | POA: Insufficient documentation

## 2024-02-09 DIAGNOSIS — I6523 Occlusion and stenosis of bilateral carotid arteries: Secondary | ICD-10-CM | POA: Diagnosis not present

## 2024-02-09 DIAGNOSIS — I4891 Unspecified atrial fibrillation: Secondary | ICD-10-CM | POA: Diagnosis not present

## 2024-02-09 DIAGNOSIS — Z7901 Long term (current) use of anticoagulants: Secondary | ICD-10-CM | POA: Diagnosis not present

## 2024-02-09 DIAGNOSIS — Z96652 Presence of left artificial knee joint: Secondary | ICD-10-CM | POA: Diagnosis not present

## 2024-02-09 DIAGNOSIS — R059 Cough, unspecified: Secondary | ICD-10-CM | POA: Diagnosis not present

## 2024-02-09 DIAGNOSIS — R009 Unspecified abnormalities of heart beat: Secondary | ICD-10-CM | POA: Diagnosis present

## 2024-02-09 DIAGNOSIS — R200 Anesthesia of skin: Secondary | ICD-10-CM | POA: Diagnosis not present

## 2024-02-09 LAB — BASIC METABOLIC PANEL WITH GFR
Anion gap: 15 (ref 5–15)
BUN: 19 mg/dL (ref 8–23)
CO2: 20 mmol/L — ABNORMAL LOW (ref 22–32)
Calcium: 9.6 mg/dL (ref 8.9–10.3)
Chloride: 102 mmol/L (ref 98–111)
Creatinine, Ser: 0.82 mg/dL (ref 0.44–1.00)
GFR, Estimated: >60 mL/min (ref >60)
Glucose, Bld: 88 mg/dL (ref 70–99)
Potassium: 4.4 mmol/L (ref 3.5–5.1)
Sodium: 137 mmol/L (ref 135–145)

## 2024-02-09 LAB — TROPONIN T, HIGH SENSITIVITY: Troponin T High Sensitivity: <15 ng/L (ref <19)

## 2024-02-09 LAB — CBC
HCT: 36.8 % (ref 36.0–46.0)
Hemoglobin: 12.4 g/dL (ref 12.0–15.0)
MCH: 32.8 pg (ref 26.0–34.0)
MCHC: 33.7 g/dL (ref 30.0–36.0)
MCV: 97.4 fL (ref 80.0–100.0)
Platelets: 373 K/uL (ref 150–400)
RBC: 3.78 MIL/uL — ABNORMAL LOW (ref 3.87–5.11)
RDW: 12.7 % (ref 11.5–15.5)
WBC: 7.4 K/uL (ref 4.0–10.5)
nRBC: 0 % (ref 0.0–0.2)

## 2024-02-09 LAB — TSH: TSH: 2.37 u[IU]/mL (ref 0.350–4.500)

## 2024-02-09 MED ORDER — DILTIAZEM HCL ER COATED BEADS 120 MG PO CP24: 120.0000 mg | ORAL_CAPSULE | Freq: Every day | ORAL | 0 refills | Status: DC

## 2024-02-09 MED ORDER — APIXABAN 5 MG PO TABS: 5.0000 mg | ORAL_TABLET | Freq: Two times a day (BID) | ORAL | 0 refills | Status: DC

## 2024-02-09 MED ORDER — DILTIAZEM HCL ER COATED BEADS 120 MG PO CP24
120.0000 mg | ORAL_CAPSULE | Freq: Once | ORAL | Status: AC
  Administered 2024-02-09: 120 mg via ORAL
  Filled 2024-02-09: qty 1

## 2024-02-09 MED ORDER — LACTATED RINGERS IV BOLUS
1000.0000 mL | Freq: Once | INTRAVENOUS | Status: AC
  Administered 2024-02-09: 1000 mL via INTRAVENOUS

## 2024-02-09 MED ORDER — DILTIAZEM HCL 25 MG/5ML IV SOLN
15.0000 mg | Freq: Once | INTRAVENOUS | Status: AC
  Administered 2024-02-09: 15 mg via INTRAVENOUS
  Filled 2024-02-09: qty 5

## 2024-02-09 NOTE — Discharge Instructions (Addendum)
 While you were in the emergency room, you received diltiazem .  This is a medication that will help control your atrial fibrillation.  I have sent a prescription for Eliquis  to your pharmacy.  You may begin taking this when you pick up your prescription, 2 times per day.  Please follow-up with your primary care doctor next week.  Please follow-up with atrial fibrillation clinic.

## 2024-02-09 NOTE — ED Triage Notes (Addendum)
 Patient arrives POV with complaints of being in suspected atrial fibrillation. Patient reports that she has been getting the notifications from her SmartWatch since for 4 months. Patient does reports that she just had surgery (left knee replaced) one week ago. Patient reports no chest pain and feeling normal.

## 2024-02-09 NOTE — ED Provider Notes (Signed)
 [place] EMERGENCY DEPARTMENT AT [facility] Provider Note  CSN: [id number] Arrival date & time: 02/09/24 1655  Chief Complaint(s) Irregular Heart Beat  HPI Sonya Luna is a 73 y.o. female who is here today for an irregular heart rate.  Patient reports that this morning when she woke up she felt her heartbeat was beating quickly.  She does that she has been having these episodes occasionally over the last several months, as reported by her Apple Watch.  She also reports back in May having 1 episode where she felt like she was having difficult time speaking, and felt as though her hand was not working how it should.  She has a past history of hyperlipidemia, had a knee replacement 8 days ago.   Past Medical History Past Medical History:  Diagnosis Date   Arthritis of knee, left    Bone spur of foot    right foot   Mallet finger    middle finger-right hand   White coat syndrome with diagnosis of hypertension    Patient Active Problem List   Diagnosis Date Noted   White coat syndrome with diagnosis of hypertension 05/15/2021   Postmenopausal 05/15/2021   Gastrocnemius muscle strain 06/28/2015   Bicipital tendonitis of right shoulder 05/12/2014   Partial tear of right rotator cuff 05/12/2014   Trigger finger, acquired 07/15/2010   ROTATOR CUFF SYNDROME, RIGHT 12/08/2009   WRIST PAIN, RIGHT 06/23/2009   DE QUERVAIN'S TENOSYNOVITIS 06/23/2009   Home Medication(s) Prior to Admission medications   Medication Sig Start Date End Date Taking? Authorizing Provider  apixaban  (ELIQUIS ) 5 MG TABS tablet Take 1 tablet (5 mg total) by mouth 2 (two) times daily. 02/09/24 03/10/24 Yes Mannie Pac T, DO  diltiazem  (CARDIZEM  CD) 120 MG 24 hr capsule Take 1 capsule (120 mg total) by mouth daily. 02/09/24 03/10/24 Yes Mannie Pac T, DO  Multiple Vitamin (MULTIVITAMIN) tablet Take 1 tablet by mouth daily.    [provider]  rosuvastatin (CRESTOR) 5 MG tablet Take 5 mg by  mouth daily.    [provider]  zolpidem (AMBIEN) 5 MG tablet Take 1 tablet by mouth as needed. 07/13/16   [provider]                                                                                                                                    Past Surgical History Past Surgical History:  Procedure Laterality Date   FOOT SURGERY Right    bone spur removal   FOOT SURGERY Left    for bone spur   KNEE ARTHROSCOPY Left 2024   Dr. Lilly   Family History Family History  Problem Relation Age of Onset   Diabetes Maternal Grandfather    Cervical cancer Paternal Grandmother    Heart attack Father        smoker-had heart surgery   Breast cancer Neg Hx     Social  History Social History   Tobacco Use   Smoking status: Never   Smokeless tobacco: Never  Vaping Use   Vaping status: Never Used  Substance Use Topics   Alcohol  use: Yes    Alcohol /week: 6.0 - 8.0 standard drinks of alcohol     Types: 6 - 8 Glasses of wine per week   Drug use: No   Allergies Patient has no known allergies.  Review of Systems Review of Systems  Physical Exam Vital Signs  I have reviewed the triage vital signs BP (!) 166/88   Pulse (!) 140   Temp 97.6 F (36.4 C) (Oral)   Resp 20   Ht 5' 6.5 (1.689 m)   Wt 59.9 kg   LMP 07/03/2002   SpO2 99%   BMI 20.99 kg/m   Physical Exam Vitals and nursing note reviewed.  HENT:     Head: Normocephalic.  Cardiovascular:     Rate and Rhythm: Tachycardia present. Rhythm irregular.  Abdominal:     General: Abdomen is flat. There is no distension.     Palpations: Abdomen is soft.     Tenderness: There is no abdominal tenderness.  Musculoskeletal:        General: Normal range of motion.     Cervical back: Normal range of motion.  Skin:    General: Skin is warm.  Neurological:     General: No focal deficit present.     Mental Status: She is alert.     Cranial Nerves: No cranial nerve deficit.     Motor: No weakness.      Gait: Gait normal.     ED Results and Treatments Labs (all labs ordered are listed, but only abnormal results are displayed) Labs Reviewed  BASIC METABOLIC PANEL WITH GFR - Abnormal; Notable for the following components:      Result Value   CO2 20 (*)    All other components within normal limits  CBC - Abnormal; Notable for the following components:   RBC 3.78 (*)    All other components within normal limits  TSH  TROPONIN T, HIGH SENSITIVITY                                                                                                                          Radiology CT Head Wo Contrast Result Date: 02/09/2024 CLINICAL DATA:  An episode of right hand numbness and difficulty with speech approximately 1 month ago, here for A-fib related concerns today. EXAM: CT HEAD WITHOUT CONTRAST TECHNIQUE: Contiguous axial images were obtained from the base of the skull through the vertex without intravenous contrast. RADIATION DOSE REDUCTION: This exam was performed according to the departmental dose-optimization program which includes automated exposure control, adjustment of the mA and/or kV according to patient size and/or use of iterative reconstruction technique. COMPARISON:  None Available. FINDINGS: Brain: Patchy and confluent areas of decreased attenuation are noted throughout the deep and periventricular white matter of the cerebral hemispheres bilaterally, compatible with  chronic microvascular ischemic disease. No evidence of large-territorial acute infarction. No parenchymal hemorrhage. No mass lesion. No extra-axial collection. No mass effect or midline shift. No hydrocephalus. Basilar cisterns are patent. Vascular: No hyperdense vessel. Atherosclerotic calcifications are present within the cavernous internal carotid arteries. Skull: No acute fracture or focal lesion. Sinuses/Orbits: Paranasal sinuses and mastoid air cells are clear. The orbits are unremarkable. Other: None. IMPRESSION: No  acute intracranial abnormality. Electronically Signed   By: Morgane  Naveau M.D.   On: 02/09/2024 18:08   DG Chest 1 View Result Date: 02/09/2024 CLINICAL DATA:  cough EXAM: CHEST  1 VIEW COMPARISON:  None available. FINDINGS: 1 cm nodular opacity along the medial right lung apex. No focal airspace consolidation, pleural effusion, or pneumothorax. No cardiomegaly. No acute fracture or destructive lesion. IMPRESSION: 1. No acute cardiopulmonary abnormality. 2. 1 cm nodular opacity along the medial right lung apex. While this may be artifactual due to overlapping bony and vascular structures, nonemergent chest CT recommended to exclude underlying lung nodule. Electronically Signed   By: Rogelia Myers M.D.   On: 02/09/2024 17:50    Pertinent labs & imaging results that were available during my care of the patient were reviewed by me and considered in my medical decision making (see MDM for details).  Medications Ordered in ED Medications  diltiazem  (CARDIZEM ) injection 15 mg (15 mg Intravenous Given 02/09/24 1735)  lactated ringers  bolus 1,000 mL (1,000 mLs Intravenous New Bag/Given 02/09/24 1735)  diltiazem  (CARDIZEM  CD) 24 hr capsule 120 mg (120 mg Oral Given 02/09/24 1823)                                                                                                                                     Procedures .Critical Care  Performed by: Mannie Fairy DASEN, DO Authorized by: Mannie Fairy DASEN, DO   Critical care provider statement:    Critical care time (minutes):  33   Critical care was necessary to treat or prevent imminent or life-threatening deterioration of the following conditions:  Cardiac failure   Critical care was time spent personally by me on the following activities:  Development of treatment plan with patient or surrogate, discussions with consultants, evaluation of patient's response to treatment, examination of patient, ordering and review of laboratory studies, ordering and  review of radiographic studies, ordering and performing treatments and interventions, pulse oximetry, re-evaluation of patient's condition and review of old charts Ultrasound ED Echo  Date/Time: 02/09/2024 6:35 PM  Performed by: Mannie Fairy DASEN, DO Authorized by: Mannie Fairy DASEN, DO   Procedure details:    Indications: chest pain     Views: parasternal long axis view     Images: archived   Findings:    Pericardium: no pericardial effusion     LV Function: normal (>50% EF)     RV Diameter: normal   Impression:    Impression: normal     (including  critical care time)  Medical Decision Making / ED Course   This patient presents to the ED for concern of irregular heart rate, this involves an extensive number of treatment options, and is a complaint that carries with it a high risk of complications and morbidity.  The differential diagnosis includes atrial fibrillation, primary A-fib, considered secondary A-fib.  MDM: Patient with A-fib, rates between 120 and 140.  Bedside ultrasound shows preserved EF.  Will start the patient on some diltiazem .  Given how long intermittently symptoms have been, patient is not a good candidate for cardioversion here in the ED due to risk of thromboembolism.  Patient with a CHADS2 score of 2 or 4 depending on if her episode back in May is a TIA.  I am inclined to believe that this was in fact a TIA.  Will obtain CT imaging of the patient's head.  Chest x-ray ordered.  Reassessment 6:30 PM-patient's heart rate now in the 80s to 110s.  She is appropriately rate controlled.  I have transitioned her to p.o. diltiazem .  I have sent a prescription for Eliquis .  Have sent referral to the A-fib clinic.  They live down the road from her PCP.  I have already discussed this with the patient's PCP.  Additional history obtained: -Additional history obtained from husband at bedside, retired infectious disease physician. -External records from outside source obtained  and reviewed including: Chart review including previous notes, labs, imaging, consultation notes   Lab Tests: -I ordered, reviewed, and interpreted labs.   The pertinent results include:   Labs Reviewed  BASIC METABOLIC PANEL WITH GFR - Abnormal; Notable for the following components:      Result Value   CO2 20 (*)    All other components within normal limits  CBC - Abnormal; Notable for the following components:   RBC 3.78 (*)    All other components within normal limits  TSH  TROPONIN T, HIGH SENSITIVITY      EKG atrial fibrillation, rapid ventricular response  EKG Interpretation Date/Time:    Ventricular Rate:    PR Interval:    QRS Duration:    QT Interval:    QTC Calculation:   R Axis:      Text Interpretation:           Imaging Studies ordered: I ordered imaging studies including chest x-ray, head CT I independently visualized and interpreted imaging. I agree with the radiologist interpretation   Medicines ordered and prescription drug management: Meds ordered this encounter  Medications   diltiazem  (CARDIZEM ) injection 15 mg   lactated ringers  bolus 1,000 mL   diltiazem  (CARDIZEM  CD) 24 hr capsule 120 mg   diltiazem  (CARDIZEM  CD) 120 MG 24 hr capsule    Sig: Take 1 capsule (120 mg total) by mouth daily.    Dispense:  30 capsule    Refill:  0   apixaban  (ELIQUIS ) 5 MG TABS tablet    Sig: Take 1 tablet (5 mg total) by mouth 2 (two) times daily.    Dispense:  60 tablet    Refill:  0    -I have reviewed the patients home medicines and have made adjustments as needed  Critical interventions Management of atrial fibrillation with rapid ventricular response.  Cardiac Monitoring: The patient was maintained on a cardiac monitor.  I personally viewed and interpreted the cardiac monitored which showed an underlying rhythm of: Atrial fibrillation  Social Determinants of Health:  Factors impacting patients care include:    Reevaluation:  After the  interventions noted above, I reevaluated the patient and found that they have :improved  Co morbidities that complicate the patient evaluation  Past Medical History:  Diagnosis Date   Arthritis of knee, left    Bone spur of foot    right foot   Mallet finger    middle finger-right hand   White coat syndrome with diagnosis of hypertension       Dispostion: I considered admission for this patient, however with her response to treatment she is appropriate for discharge.     Final Clinical Impression(s) / ED Diagnoses Final diagnoses:  Persistent atrial fibrillation [facility])     @PCDICTATION @    Mannie Pac T, DO 02/09/24 RONOLD

## 2024-02-11 DIAGNOSIS — Z96652 Presence of left artificial knee joint: Secondary | ICD-10-CM | POA: Diagnosis not present

## 2024-02-11 DIAGNOSIS — M6281 Muscle weakness (generalized): Secondary | ICD-10-CM | POA: Diagnosis not present

## 2024-02-11 DIAGNOSIS — M25662 Stiffness of left knee, not elsewhere classified: Secondary | ICD-10-CM | POA: Diagnosis not present

## 2024-02-11 DIAGNOSIS — R269 Unspecified abnormalities of gait and mobility: Secondary | ICD-10-CM | POA: Diagnosis not present

## 2024-02-11 DIAGNOSIS — M1712 Unilateral primary osteoarthritis, left knee: Secondary | ICD-10-CM | POA: Diagnosis not present

## 2024-02-14 DIAGNOSIS — R269 Unspecified abnormalities of gait and mobility: Secondary | ICD-10-CM | POA: Diagnosis not present

## 2024-02-14 DIAGNOSIS — M1712 Unilateral primary osteoarthritis, left knee: Secondary | ICD-10-CM | POA: Diagnosis not present

## 2024-02-14 DIAGNOSIS — M25662 Stiffness of left knee, not elsewhere classified: Secondary | ICD-10-CM | POA: Diagnosis not present

## 2024-02-14 DIAGNOSIS — Z96652 Presence of left artificial knee joint: Secondary | ICD-10-CM | POA: Diagnosis not present

## 2024-02-14 DIAGNOSIS — M6281 Muscle weakness (generalized): Secondary | ICD-10-CM | POA: Diagnosis not present

## 2024-02-18 DIAGNOSIS — M25662 Stiffness of left knee, not elsewhere classified: Secondary | ICD-10-CM | POA: Diagnosis not present

## 2024-02-18 DIAGNOSIS — R269 Unspecified abnormalities of gait and mobility: Secondary | ICD-10-CM | POA: Diagnosis not present

## 2024-02-18 DIAGNOSIS — Z96652 Presence of left artificial knee joint: Secondary | ICD-10-CM | POA: Diagnosis not present

## 2024-02-18 DIAGNOSIS — M1712 Unilateral primary osteoarthritis, left knee: Secondary | ICD-10-CM | POA: Diagnosis not present

## 2024-02-18 DIAGNOSIS — M6281 Muscle weakness (generalized): Secondary | ICD-10-CM | POA: Diagnosis not present

## 2024-02-20 DIAGNOSIS — Z96652 Presence of left artificial knee joint: Secondary | ICD-10-CM | POA: Diagnosis not present

## 2024-02-20 DIAGNOSIS — M6281 Muscle weakness (generalized): Secondary | ICD-10-CM | POA: Diagnosis not present

## 2024-02-20 DIAGNOSIS — M1712 Unilateral primary osteoarthritis, left knee: Secondary | ICD-10-CM | POA: Diagnosis not present

## 2024-02-20 DIAGNOSIS — R269 Unspecified abnormalities of gait and mobility: Secondary | ICD-10-CM | POA: Diagnosis not present

## 2024-02-20 DIAGNOSIS — M25662 Stiffness of left knee, not elsewhere classified: Secondary | ICD-10-CM | POA: Diagnosis not present

## 2024-02-22 ENCOUNTER — Ambulatory Visit (HOSPITAL_COMMUNITY)
Admission: RE | Admit: 2024-02-22 | Discharge: 2024-02-22 | Disposition: A | Discharge status: Home / Self Care | Source: Ambulatory Visit | Attending: Internal Medicine | Admitting: Internal Medicine

## 2024-02-22 VITALS — BP 132/84 | HR 69 | Ht 66.5 in | Wt 135.4 lb

## 2024-02-22 DIAGNOSIS — D6869 Other thrombophilia: Secondary | ICD-10-CM

## 2024-02-22 DIAGNOSIS — I4891 Unspecified atrial fibrillation: Secondary | ICD-10-CM

## 2024-02-22 DIAGNOSIS — I480 Paroxysmal atrial fibrillation: Secondary | ICD-10-CM

## 2024-02-22 MED ORDER — DILTIAZEM HCL ER COATED BEADS 120 MG PO CP24: 120.0000 mg | ORAL_CAPSULE | Freq: Every day | ORAL | 6 refills | Status: AC

## 2024-02-22 MED ORDER — APIXABAN 5 MG PO TABS: 5.0000 mg | ORAL_TABLET | Freq: Two times a day (BID) | ORAL | 6 refills | Status: AC

## 2024-02-22 NOTE — Progress Notes (Signed)
 Primary Care Physician: Shayne Anes, MD Primary Cardiologist: None Electrophysiologist: None     Referring Physician: Mannie Fairy DASEN, DO     Sonya Luna is a 73 y.o. female with a history of white coat HTN, possible TIA 11/2023, and atrial fibrillation who presents for consultation in the [facility]. ED visit on 02/09/24 for Afib with RVR; noted that patient had knee replacement 8 days prior. Patient started on diltiazem  120 mg daily. Patient is on Eliquis  5 mg BID for a CHADS2VASC score of likely 4.  On evaluation today, patient is currently in NSR. She wears Apple watch and it has not showed any Afib since ED visit. She drinks 1-2 cups of coffee daily. No symptoms to suggest sleep apnea. She is recovering from a left total knee replacement ~3 weeks ago.  Today, she denies symptoms of chest pain, shortness of breath, orthopnea, PND, lower extremity edema, dizziness, presyncope, syncope, snoring, daytime somnolence, bleeding, or neurologic sequela. The patient is tolerating medications without difficulties and is otherwise without complaint today.    Atrial Fibrillation Risk Factors:  she does not have symptoms or diagnosis of sleep apnea.   she has a BMI of Body mass index is 21.53 kg/m.SABRA Filed Weights   02/22/24 1329  Weight: 61.4 kg    Current Outpatient Medications  Medication Sig Dispense Refill   Collagen-Vitamin C-Biotin (COLLAGEN PO) Takes - 1 tablespoon weekly     CREATINE PO Takes 1 teaspoon daily     methocarbamol (ROBAXIN) 500 MG tablet Take 500 mg by mouth every 6 (six) hours as needed.     Multiple Vitamin (MULTIVITAMIN) tablet Take 1 tablet by mouth daily.     oxyCODONE (OXY IR/ROXICODONE) 5 MG immediate release tablet Take 5 mg by mouth every 8 (eight) hours as needed.     rosuvastatin (CRESTOR) 5 MG tablet Take 5 mg by mouth daily.     zolpidem (AMBIEN) 5 MG tablet Take 1 tablet by mouth as needed.     apixaban  (ELIQUIS ) 5  MG TABS tablet Take 1 tablet (5 mg total) by mouth 2 (two) times daily. 60 tablet 6   diltiazem  (CARDIZEM  CD) 120 MG 24 hr capsule Take 1 capsule (120 mg total) by mouth daily. 30 capsule 6   No current facility-administered medications for this encounter.    Atrial Fibrillation Management history:  Previous antiarrhythmic drugs: none Previous cardioversions: none Previous ablations: none Anticoagulation history: Eliquis    ROS- All systems are reviewed and negative except as per the HPI above.  Physical Exam: BP 132/84   Pulse 69   Ht 5' 6.5 (1.689 m)   Wt 61.4 kg   LMP 07/03/2002   BMI 21.53 kg/m   GEN: Well nourished, well developed in no acute distress NECK: No JVD; No carotid bruits CARDIAC: Regular rate and rhythm, no murmurs, rubs, gallops RESPIRATORY:  Clear to auscultation without rales, wheezing or rhonchi  ABDOMEN: Soft, non-tender, non-distended EXTREMITIES:  No edema; No deformity   EKG today demonstrates  Vent. rate 69 BPM PR interval 140 ms QRS duration 72 ms QT/QTcB 392/420 ms P-R-T axes 70 57 49 Normal sinus rhythm Nonspecific ST abnormality Abnormal ECG When compared with ECG of 09-Feb-2024 18:22, PREVIOUS ECG IS PRESENT  Echo N/A  ASSESSMENT & PLAN CHA2DS2-VASc Score = 5  The patient's score is based upon: CHF History: 0 HTN History: 1 Diabetes History: 0 Stroke History: 2 Vascular Disease History: 0 Age Score: 1 Gender Score: 1  ASSESSMENT AND PLAN: Paroxysmal Atrial Fibrillation (ICD10:  I48.0) The patient's CHA2DS2-VASc score is 5, indicating a 7.2% annual risk of stroke.    She is currently in NSR. Education provided about Afib. Discussion about triggers for Afib. Will order baseline echocardiogram. Continue diltiazem  120 mg daily. Continue observation via Apple watch.  Discussed finding on head CT done in ED that atherosclerotic calcifications are present within the cavernous internal carotid arteries. They will discuss  findings with PCP given patient already on crestor 5 mg daily.    Secondary Hypercoagulable State (ICD10:  D68.69) The patient is at significant risk for stroke/thromboembolism based upon her CHA2DS2-VASc Score of 5.  Continue Apixaban  (Eliquis ).  We discussed the reasoning behind anticoagulation in the setting of stroke prevention related to Afib. We discussed the benefits vs risks of anticoagulation. After discussion, patient would like to continue anticoagulation and understands the potential risks. Continue Eliquis  5 mg BID.      Follow up 3 months Afib clinic.    Terra Pac, PA-C  Afib Clinic [facility] [address] [place], [place] [address] [phone]

## 2024-02-22 NOTE — Patient Instructions (Signed)
 Echocardiogram -- scheduling will call once insurance authorization received.

## 2024-02-27 DIAGNOSIS — Z96652 Presence of left artificial knee joint: Secondary | ICD-10-CM | POA: Diagnosis not present

## 2024-02-27 DIAGNOSIS — M6281 Muscle weakness (generalized): Secondary | ICD-10-CM | POA: Diagnosis not present

## 2024-02-27 DIAGNOSIS — M25662 Stiffness of left knee, not elsewhere classified: Secondary | ICD-10-CM | POA: Diagnosis not present

## 2024-02-27 DIAGNOSIS — R269 Unspecified abnormalities of gait and mobility: Secondary | ICD-10-CM | POA: Diagnosis not present

## 2024-02-27 DIAGNOSIS — M1712 Unilateral primary osteoarthritis, left knee: Secondary | ICD-10-CM | POA: Diagnosis not present

## 2024-02-28 ENCOUNTER — Other Ambulatory Visit (HOSPITAL_COMMUNITY): Payer: Self-pay

## 2024-02-28 DIAGNOSIS — I480 Paroxysmal atrial fibrillation: Secondary | ICD-10-CM

## 2024-03-04 ENCOUNTER — Ambulatory Visit (HOSPITAL_COMMUNITY)
Admission: RE | Admit: 2024-03-04 | Discharge: 2024-03-04 | Disposition: A | Discharge status: Home / Self Care | Source: Ambulatory Visit | Attending: Internal Medicine | Admitting: Internal Medicine

## 2024-03-04 DIAGNOSIS — M6281 Muscle weakness (generalized): Secondary | ICD-10-CM | POA: Diagnosis not present

## 2024-03-04 DIAGNOSIS — M1712 Unilateral primary osteoarthritis, left knee: Secondary | ICD-10-CM | POA: Diagnosis not present

## 2024-03-04 DIAGNOSIS — I480 Paroxysmal atrial fibrillation: Secondary | ICD-10-CM | POA: Diagnosis not present

## 2024-03-04 DIAGNOSIS — R269 Unspecified abnormalities of gait and mobility: Secondary | ICD-10-CM | POA: Diagnosis not present

## 2024-03-04 DIAGNOSIS — Z96652 Presence of left artificial knee joint: Secondary | ICD-10-CM | POA: Diagnosis not present

## 2024-03-04 DIAGNOSIS — M25662 Stiffness of left knee, not elsewhere classified: Secondary | ICD-10-CM | POA: Diagnosis not present

## 2024-03-04 LAB — ECHOCARDIOGRAM COMPLETE
Area-P 1/2: 2.47 cm2
P 1/2 time: 599 ms
S' Lateral: 3 cm

## 2024-03-05 ENCOUNTER — Ambulatory Visit (HOSPITAL_COMMUNITY): Payer: Self-pay | Admitting: Internal Medicine

## 2024-03-07 DIAGNOSIS — M25662 Stiffness of left knee, not elsewhere classified: Secondary | ICD-10-CM | POA: Diagnosis not present

## 2024-03-07 DIAGNOSIS — M1712 Unilateral primary osteoarthritis, left knee: Secondary | ICD-10-CM | POA: Diagnosis not present

## 2024-03-07 DIAGNOSIS — R269 Unspecified abnormalities of gait and mobility: Secondary | ICD-10-CM | POA: Diagnosis not present

## 2024-03-07 DIAGNOSIS — M6281 Muscle weakness (generalized): Secondary | ICD-10-CM | POA: Diagnosis not present

## 2024-03-07 DIAGNOSIS — Z96652 Presence of left artificial knee joint: Secondary | ICD-10-CM | POA: Diagnosis not present

## 2024-03-10 DIAGNOSIS — R269 Unspecified abnormalities of gait and mobility: Secondary | ICD-10-CM | POA: Diagnosis not present

## 2024-03-10 DIAGNOSIS — M6281 Muscle weakness (generalized): Secondary | ICD-10-CM | POA: Diagnosis not present

## 2024-03-10 DIAGNOSIS — M25662 Stiffness of left knee, not elsewhere classified: Secondary | ICD-10-CM | POA: Diagnosis not present

## 2024-03-10 DIAGNOSIS — Z96652 Presence of left artificial knee joint: Secondary | ICD-10-CM | POA: Diagnosis not present

## 2024-03-10 DIAGNOSIS — M1712 Unilateral primary osteoarthritis, left knee: Secondary | ICD-10-CM | POA: Diagnosis not present

## 2024-03-12 DIAGNOSIS — Z96652 Presence of left artificial knee joint: Secondary | ICD-10-CM | POA: Diagnosis not present

## 2024-03-12 DIAGNOSIS — M25662 Stiffness of left knee, not elsewhere classified: Secondary | ICD-10-CM | POA: Diagnosis not present

## 2024-03-12 DIAGNOSIS — M1712 Unilateral primary osteoarthritis, left knee: Secondary | ICD-10-CM | POA: Diagnosis not present

## 2024-03-12 DIAGNOSIS — R269 Unspecified abnormalities of gait and mobility: Secondary | ICD-10-CM | POA: Diagnosis not present

## 2024-03-12 DIAGNOSIS — M6281 Muscle weakness (generalized): Secondary | ICD-10-CM | POA: Diagnosis not present

## 2024-03-13 DIAGNOSIS — M1712 Unilateral primary osteoarthritis, left knee: Secondary | ICD-10-CM | POA: Diagnosis not present

## 2024-03-17 DIAGNOSIS — M1712 Unilateral primary osteoarthritis, left knee: Secondary | ICD-10-CM | POA: Diagnosis not present

## 2024-03-17 DIAGNOSIS — R269 Unspecified abnormalities of gait and mobility: Secondary | ICD-10-CM | POA: Diagnosis not present

## 2024-03-17 DIAGNOSIS — Z96652 Presence of left artificial knee joint: Secondary | ICD-10-CM | POA: Diagnosis not present

## 2024-03-17 DIAGNOSIS — M25662 Stiffness of left knee, not elsewhere classified: Secondary | ICD-10-CM | POA: Diagnosis not present

## 2024-03-17 DIAGNOSIS — M6281 Muscle weakness (generalized): Secondary | ICD-10-CM | POA: Diagnosis not present

## 2024-03-19 DIAGNOSIS — R269 Unspecified abnormalities of gait and mobility: Secondary | ICD-10-CM | POA: Diagnosis not present

## 2024-03-19 DIAGNOSIS — Z96652 Presence of left artificial knee joint: Secondary | ICD-10-CM | POA: Diagnosis not present

## 2024-03-19 DIAGNOSIS — M6281 Muscle weakness (generalized): Secondary | ICD-10-CM | POA: Diagnosis not present

## 2024-03-19 DIAGNOSIS — M25662 Stiffness of left knee, not elsewhere classified: Secondary | ICD-10-CM | POA: Diagnosis not present

## 2024-03-19 DIAGNOSIS — M1712 Unilateral primary osteoarthritis, left knee: Secondary | ICD-10-CM | POA: Diagnosis not present

## 2024-03-24 DIAGNOSIS — M1712 Unilateral primary osteoarthritis, left knee: Secondary | ICD-10-CM | POA: Diagnosis not present

## 2024-03-24 DIAGNOSIS — M6281 Muscle weakness (generalized): Secondary | ICD-10-CM | POA: Diagnosis not present

## 2024-03-24 DIAGNOSIS — Z96652 Presence of left artificial knee joint: Secondary | ICD-10-CM | POA: Diagnosis not present

## 2024-03-24 DIAGNOSIS — R269 Unspecified abnormalities of gait and mobility: Secondary | ICD-10-CM | POA: Diagnosis not present

## 2024-03-24 DIAGNOSIS — M25662 Stiffness of left knee, not elsewhere classified: Secondary | ICD-10-CM | POA: Diagnosis not present

## 2024-03-26 DIAGNOSIS — M1712 Unilateral primary osteoarthritis, left knee: Secondary | ICD-10-CM | POA: Diagnosis not present

## 2024-03-26 DIAGNOSIS — M25662 Stiffness of left knee, not elsewhere classified: Secondary | ICD-10-CM | POA: Diagnosis not present

## 2024-03-26 DIAGNOSIS — M6281 Muscle weakness (generalized): Secondary | ICD-10-CM | POA: Diagnosis not present

## 2024-03-26 DIAGNOSIS — Z96652 Presence of left artificial knee joint: Secondary | ICD-10-CM | POA: Diagnosis not present

## 2024-03-26 DIAGNOSIS — R269 Unspecified abnormalities of gait and mobility: Secondary | ICD-10-CM | POA: Diagnosis not present

## 2024-03-29 IMAGING — MG MM DIGITAL SCREENING BILAT W/ TOMO AND CAD
6 of 10 series · 6 of 30 positions shown · non-contrast
Comparison: Previous exam(s).

CLINICAL DATA: Screening.

EXAM:
DIGITAL SCREENING BILATERAL MAMMOGRAM WITH TOMOSYNTHESIS AND CAD
TECHNIQUE: Bilateral screening digital craniocaudal and mediolateral oblique
mammograms were obtained. Bilateral screening digital breast
tomosynthesis was performed. The images were evaluated with
computer-aided detection.

[L MLO synth-2D]
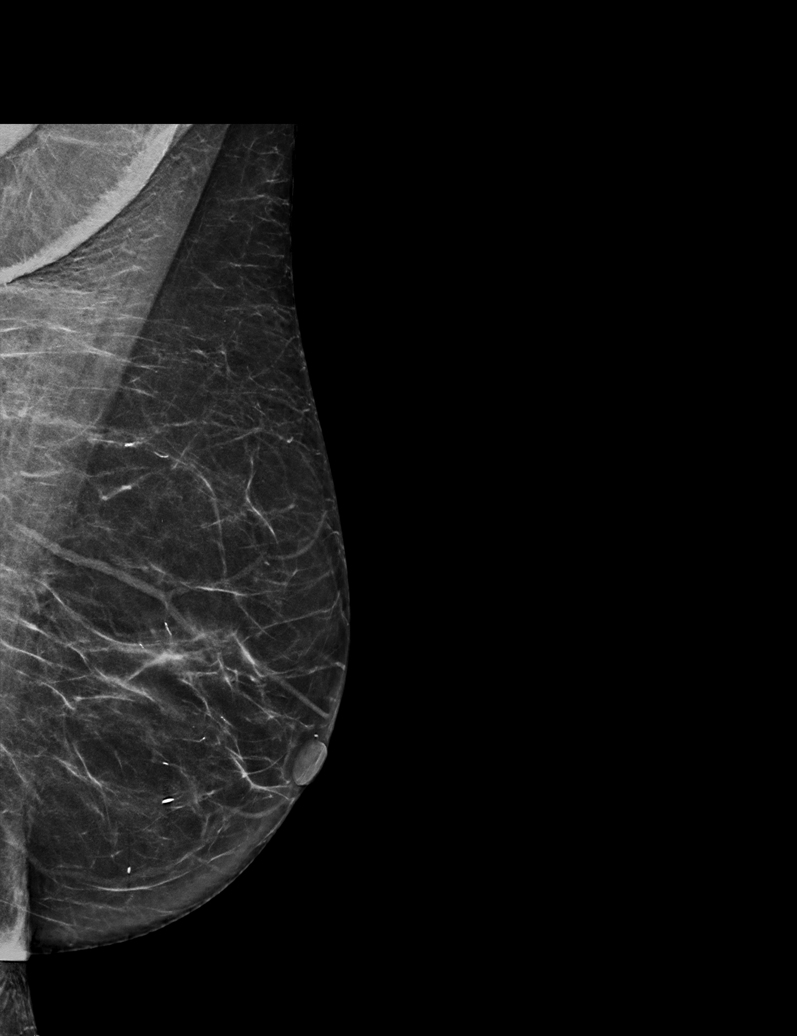

[R CC synth-2D]
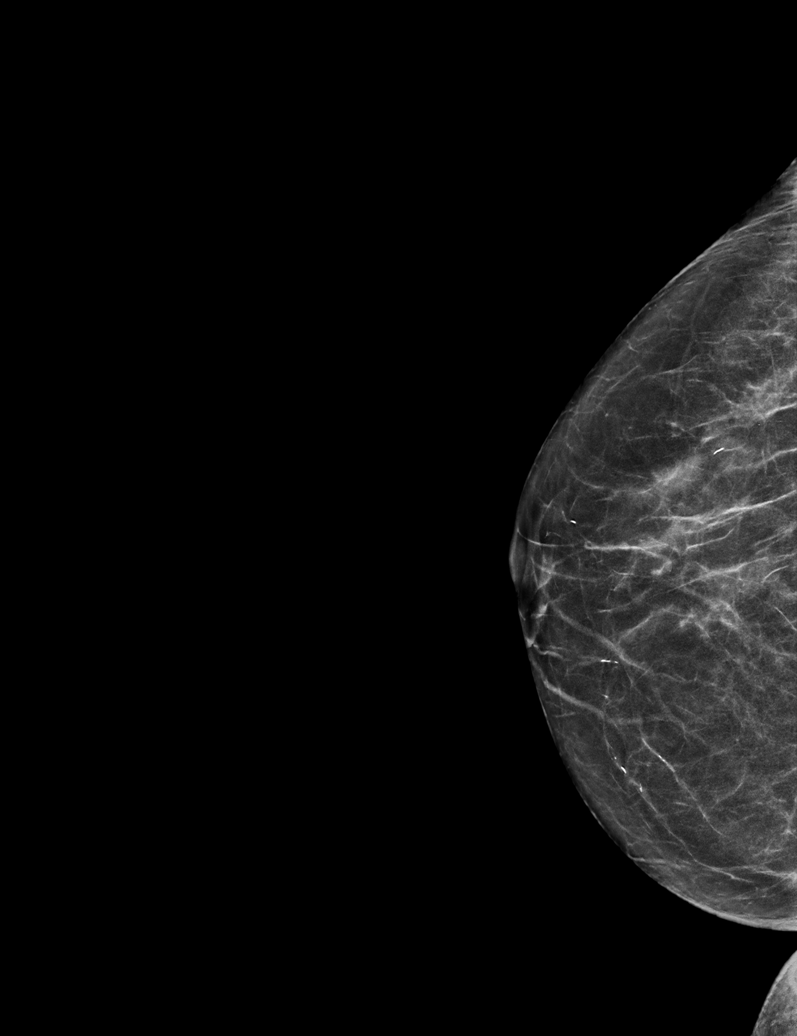

[L CC synth-2D (1 of 2)]
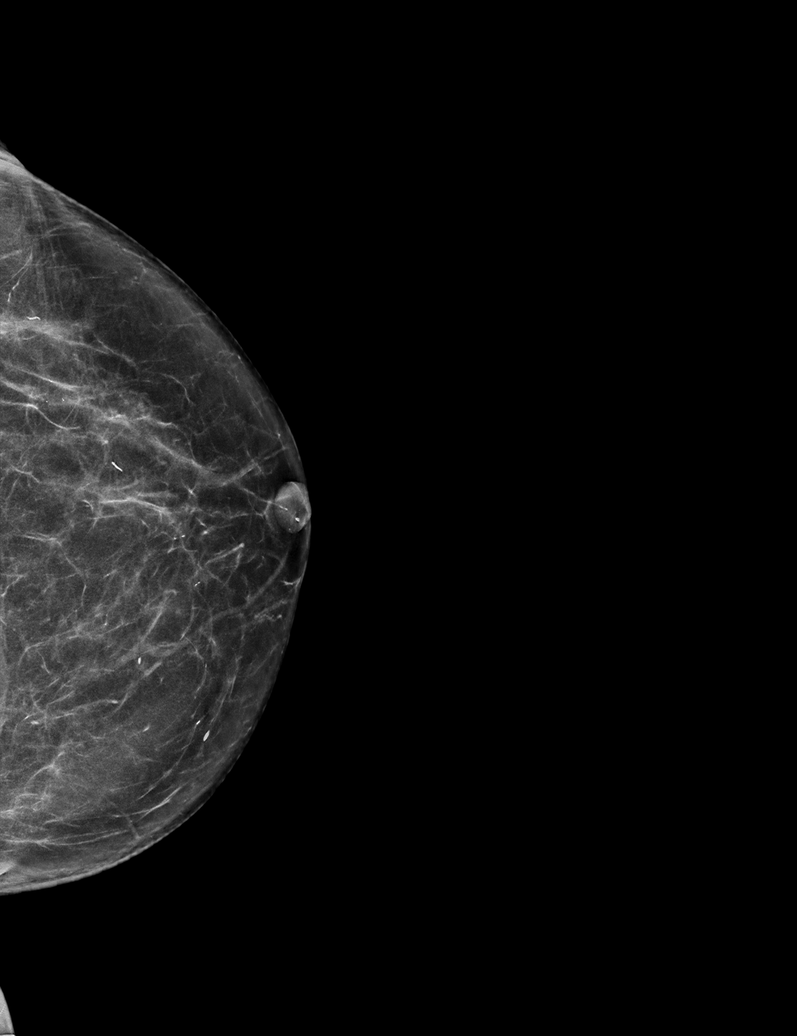

[L CC synth-2D (2 of 2)]
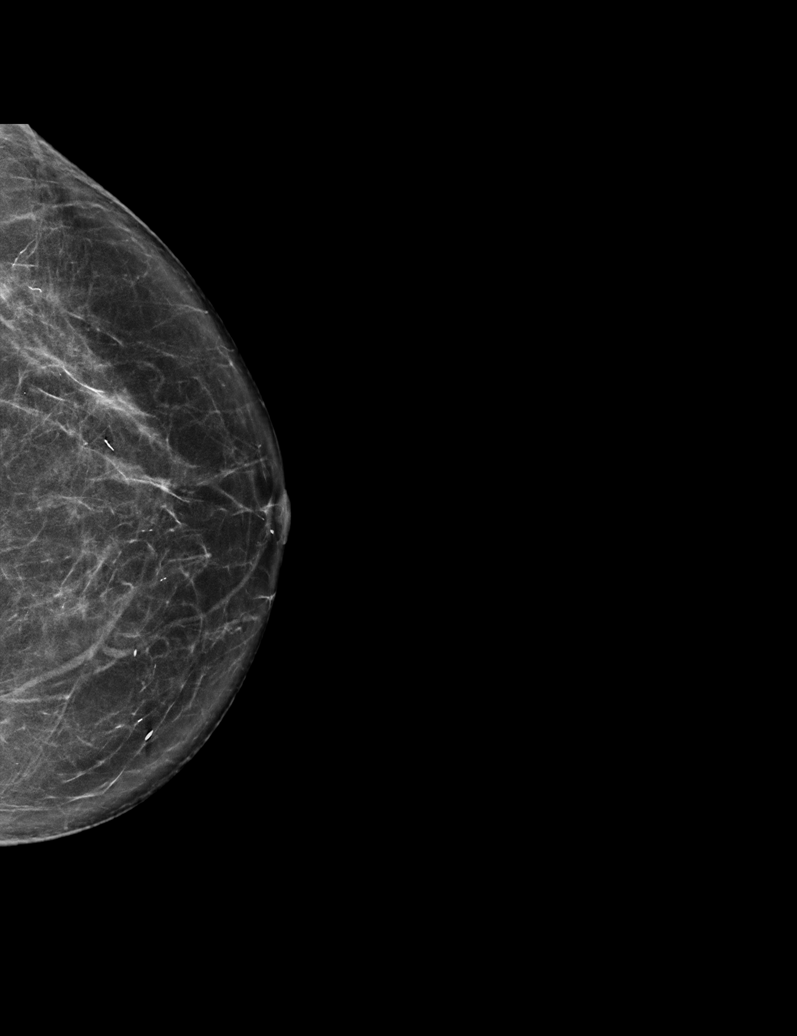

[R MLO synth-2D]
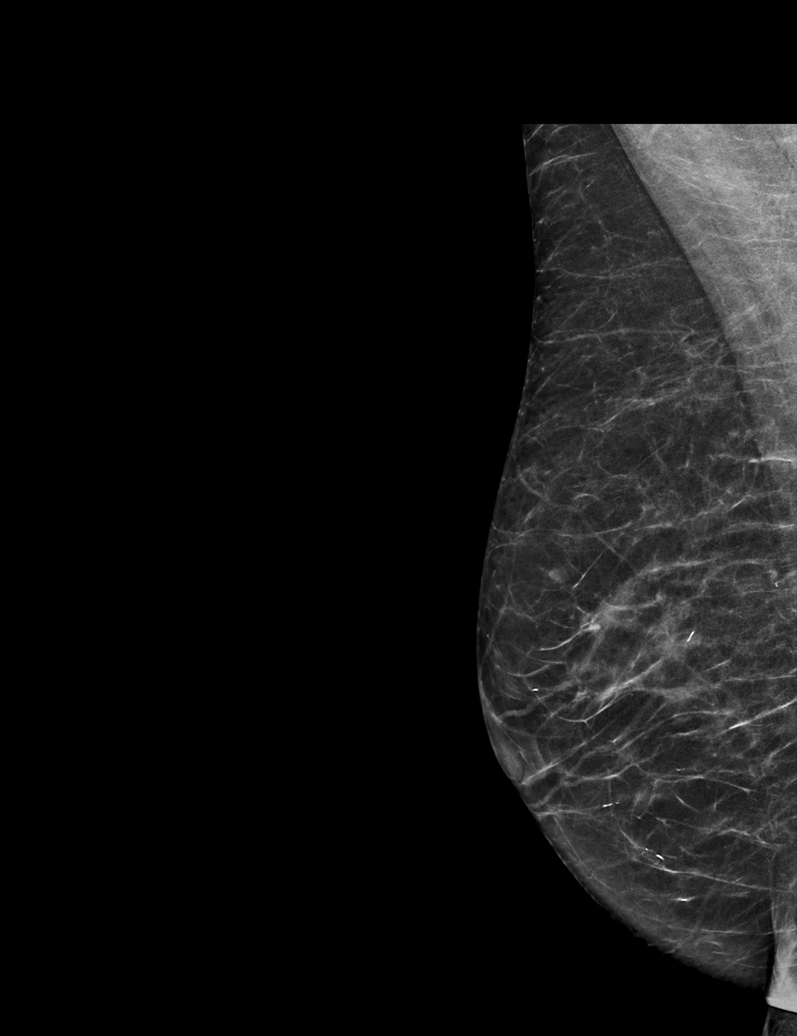

[R MLO tomo · tomo slice 27/53.0]
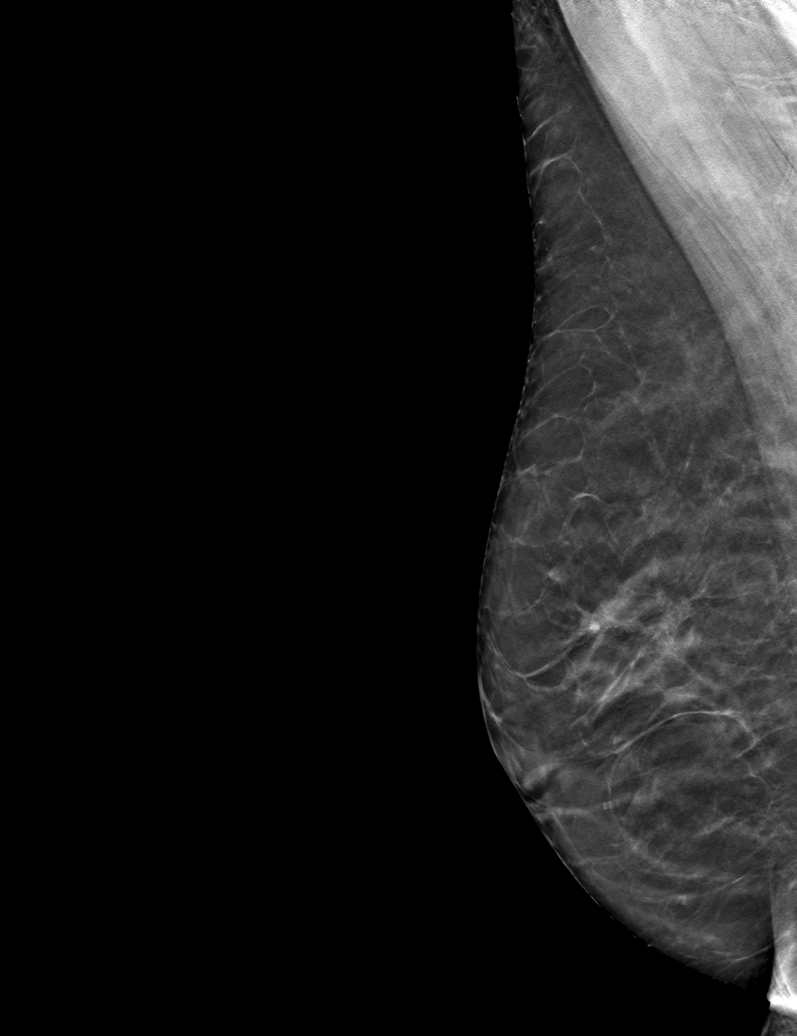

[6 of 30 positions shown; findings below may reference images not displayed]

ACR Breast Density Category b: There are scattered areas of
fibroglandular density.
FINDINGS: There are no findings suspicious for malignancy.
IMPRESSION: No mammographic evidence of malignancy. A result letter of this
screening mammogram will be mailed directly to the patient.

RECOMMENDATION:
Screening mammogram in one year. (Code:51-O-LD2)

BI-RADS CATEGORY  1: Negative.

## 2024-04-02 DIAGNOSIS — R269 Unspecified abnormalities of gait and mobility: Secondary | ICD-10-CM | POA: Diagnosis not present

## 2024-04-02 DIAGNOSIS — M1712 Unilateral primary osteoarthritis, left knee: Secondary | ICD-10-CM | POA: Diagnosis not present

## 2024-04-02 DIAGNOSIS — M25662 Stiffness of left knee, not elsewhere classified: Secondary | ICD-10-CM | POA: Diagnosis not present

## 2024-04-02 DIAGNOSIS — Z96652 Presence of left artificial knee joint: Secondary | ICD-10-CM | POA: Diagnosis not present

## 2024-04-02 DIAGNOSIS — M6281 Muscle weakness (generalized): Secondary | ICD-10-CM | POA: Diagnosis not present

## 2024-04-08 DIAGNOSIS — M6281 Muscle weakness (generalized): Secondary | ICD-10-CM | POA: Diagnosis not present

## 2024-04-08 DIAGNOSIS — R269 Unspecified abnormalities of gait and mobility: Secondary | ICD-10-CM | POA: Diagnosis not present

## 2024-04-08 DIAGNOSIS — M1712 Unilateral primary osteoarthritis, left knee: Secondary | ICD-10-CM | POA: Diagnosis not present

## 2024-04-08 DIAGNOSIS — M25662 Stiffness of left knee, not elsewhere classified: Secondary | ICD-10-CM | POA: Diagnosis not present

## 2024-04-08 DIAGNOSIS — Z96652 Presence of left artificial knee joint: Secondary | ICD-10-CM | POA: Diagnosis not present

## 2024-04-15 DIAGNOSIS — R269 Unspecified abnormalities of gait and mobility: Secondary | ICD-10-CM | POA: Diagnosis not present

## 2024-04-15 DIAGNOSIS — M1712 Unilateral primary osteoarthritis, left knee: Secondary | ICD-10-CM | POA: Diagnosis not present

## 2024-04-15 DIAGNOSIS — M6281 Muscle weakness (generalized): Secondary | ICD-10-CM | POA: Diagnosis not present

## 2024-04-15 DIAGNOSIS — Z96652 Presence of left artificial knee joint: Secondary | ICD-10-CM | POA: Diagnosis not present

## 2024-04-15 DIAGNOSIS — M25662 Stiffness of left knee, not elsewhere classified: Secondary | ICD-10-CM | POA: Diagnosis not present

## 2024-04-15 DIAGNOSIS — M542 Cervicalgia: Secondary | ICD-10-CM | POA: Diagnosis not present

## 2024-04-15 DIAGNOSIS — M25519 Pain in unspecified shoulder: Secondary | ICD-10-CM | POA: Diagnosis not present

## 2024-04-21 DIAGNOSIS — M8589 Other specified disorders of bone density and structure, multiple sites: Secondary | ICD-10-CM | POA: Diagnosis not present

## 2024-05-14 DIAGNOSIS — M25519 Pain in unspecified shoulder: Secondary | ICD-10-CM | POA: Diagnosis not present

## 2024-05-14 DIAGNOSIS — M542 Cervicalgia: Secondary | ICD-10-CM | POA: Diagnosis not present

## 2024-05-22 DIAGNOSIS — M25519 Pain in unspecified shoulder: Secondary | ICD-10-CM | POA: Diagnosis not present

## 2024-05-22 DIAGNOSIS — Z1212 Encounter for screening for malignant neoplasm of rectum: Secondary | ICD-10-CM | POA: Diagnosis not present

## 2024-05-22 DIAGNOSIS — M542 Cervicalgia: Secondary | ICD-10-CM | POA: Diagnosis not present

## 2024-05-22 DIAGNOSIS — E7849 Other hyperlipidemia: Secondary | ICD-10-CM | POA: Diagnosis not present

## 2024-05-27 ENCOUNTER — Encounter (HOSPITAL_COMMUNITY): Payer: Self-pay | Admitting: Internal Medicine

## 2024-05-27 ENCOUNTER — Ambulatory Visit (HOSPITAL_COMMUNITY)
Admission: RE | Admit: 2024-05-27 | Discharge: 2024-05-27 | Disposition: A | Discharge status: Home / Self Care | Source: Ambulatory Visit | Attending: Internal Medicine | Admitting: Internal Medicine

## 2024-05-27 VITALS — BP 106/78 | HR 68 | Ht 66.5 in | Wt 133.2 lb

## 2024-05-27 DIAGNOSIS — I480 Paroxysmal atrial fibrillation: Secondary | ICD-10-CM | POA: Diagnosis not present

## 2024-05-27 DIAGNOSIS — D6869 Other thrombophilia: Secondary | ICD-10-CM

## 2024-05-27 NOTE — Progress Notes (Signed)
 Primary Care Physician: Shayne Anes, MD Primary Cardiologist: None Electrophysiologist: None     Referring Physician: Mannie Fairy DASEN, DO     Sonya Luna is a 73 y.o. female with a history of white coat HTN, possible TIA 11/2023, and atrial fibrillation who presents for consultation in the [facility]ibr[facility]. ED visit on 02/09/24 for Afib with RVR; noted that patient had knee replacement 8 days prior. Patient started on diltiazem  120 mg daily. Patient is on Eliquis  5 mg BID for a CHADS2VASC score of likely 4.  On evaluation today, patient is currently in NSR. She wears Apple watch and it has not showed any Afib since ED visit. She drinks 1-2 cups of coffee daily. No symptoms to suggest sleep apnea. She is recovering from a left total knee replacement ~3 weeks ago.  On follow-up 05/27/2024, patient is currently in NSR.  She has had overall no A-fib burden since last office visit.  Her Apple Watch has not notified her of any new episodes.  She feels like she has recovered from her knee surgery and is back to doing yoga and Pilates without issue.  She is taking diltiazem  120 mg daily.  No bleeding issues on Eliquis .  Today, she denies symptoms of chest pain, shortness of breath, orthopnea, PND, lower extremity edema, dizziness, presyncope, syncope, snoring, daytime somnolence, bleeding, or neurologic sequela. The patient is tolerating medications without difficulties and is otherwise without complaint today.    Atrial Fibrillation Risk Factors:  she does not have symptoms or diagnosis of sleep apnea.   she has a BMI of Body mass index is 21.18 kg/m.SABRA Filed Weights   05/27/24 1343  Weight: 60.4 kg     Current Outpatient Medications  Medication Sig Dispense Refill   apixaban  (ELIQUIS ) 5 MG TABS tablet Take 1 tablet (5 mg total) by mouth 2 (two) times daily. 60 tablet 6   Collagen-Vitamin C-Biotin (COLLAGEN PO) Takes - 1 tablespoon weekly     CREATINE PO  Takes 1 teaspoon daily     diltiazem  (CARDIZEM  CD) 120 MG 24 hr capsule Take 1 capsule (120 mg total) by mouth daily. 30 capsule 6   methocarbamol (ROBAXIN) 500 MG tablet Take 500 mg by mouth every 6 (six) hours as needed.     Multiple Vitamin (MULTIVITAMIN) tablet Take 1 tablet by mouth daily.     oxyCODONE (OXY IR/ROXICODONE) 5 MG immediate release tablet Take 5 mg by mouth every 8 (eight) hours as needed.     rosuvastatin (CRESTOR) 5 MG tablet Take 5 mg by mouth daily.     zolpidem (AMBIEN) 5 MG tablet Take 1 tablet by mouth as needed.     No current facility-administered medications for this encounter.    Atrial Fibrillation Management history:  Previous antiarrhythmic drugs: none Previous cardioversions: none Previous ablations: none Anticoagulation history: Eliquis    ROS- All systems are reviewed and negative except as per the HPI above.  Physical Exam: BP 106/78   Pulse 68   Ht 5' 6.5 (1.689 m)   Wt 60.4 kg   LMP 07/03/2002   BMI 21.18 kg/m   GEN- The patient is well appearing, alert and oriented x 3 today.   Neck - no JVD or carotid bruit noted Lungs- Clear to ausculation bilaterally, normal work of breathing Heart- Regular rate and rhythm, no murmurs, rubs or gallops, PMI not laterally displaced Extremities- no clubbing, cyanosis, or edema Skin - no rash or ecchymosis noted   EKG  today demonstrates  EKG Interpretation Date/Time:  Tuesday May 27 2024 13:46:58 EST Ventricular Rate:  68 PR Interval:  126 QRS Duration:  74 QT Interval:  404 QTC Calculation: 429 R Axis:   58  Text Interpretation: Normal sinus rhythm Biatrial enlargement Abnormal ECG When compared with ECG of 22-Feb-2024 13:36, No significant change was found Confirmed by Terra Pac ([id number]) on 05/27/2024 2:01:51 PM    Echo 03/04/2024: 1. Left ventricular ejection fraction, by estimation, is 55 to 60%. The  left ventricle has normal function. The left ventricle has no regional  wall  motion abnormalities. Left ventricular diastolic parameters are  consistent with Grade I diastolic  dysfunction (impaired relaxation).   2. Right ventricular systolic function is normal. The right ventricular  size is normal. There is normal pulmonary artery systolic pressure. The  estimated right ventricular systolic pressure is 28.2 mmHg.   3. Left atrial size was mildly dilated.   4. The mitral valve is normal in structure. Mild mitral valve  regurgitation. No evidence of mitral stenosis.   5. The aortic valve is tricuspid. Aortic valve regurgitation is trivial.  No aortic stenosis is present.   6. The inferior vena cava is normal in size with greater than 50%  respiratory variability, suggesting right atrial pressure of 3 mmHg.    ASSESSMENT & PLAN CHA2DS2-VASc Score = 5  The patient's score is based upon: CHF History: 0 HTN History: 1 Diabetes History: 0 Stroke History: 2 Vascular Disease History: 0 Age Score: 1 Gender Score: 1       ASSESSMENT AND PLAN: Paroxysmal Atrial Fibrillation (ICD10:  I48.0) The patient's CHA2DS2-VASc score is 5, indicating a 7.2% annual risk of stroke.    Patient is currently in NSR.  She has had overall very low A-fib burden.  She is happy with current management and we will not make any changes at this time.  Continue diltiazem  120 mg daily.  Secondary Hypercoagulable State (ICD10:  D68.69) The patient is at significant risk for stroke/thromboembolism based upon her CHA2DS2-VASc Score of 5.  Continue Apixaban  (Eliquis ).  Continue Eliquis  5 mg twice daily.     Follow up 6 months Afib clinic.    Terra Pac, PA-C  Afib Clinic [facility] [address] [place], [place] [address] [phone]

## 2024-06-03 DIAGNOSIS — M542 Cervicalgia: Secondary | ICD-10-CM | POA: Diagnosis not present

## 2024-06-03 DIAGNOSIS — M25519 Pain in unspecified shoulder: Secondary | ICD-10-CM | POA: Diagnosis not present

## 2024-06-04 DIAGNOSIS — H524 Presbyopia: Secondary | ICD-10-CM | POA: Diagnosis not present

## 2024-06-06 DIAGNOSIS — R82998 Other abnormal findings in urine: Secondary | ICD-10-CM | POA: Diagnosis not present

## 2024-06-24 ENCOUNTER — Other Ambulatory Visit

## 2024-09-10 ENCOUNTER — Ambulatory Visit: Admitting: Cardiovascular Disease

## 2024-11-26 ENCOUNTER — Ambulatory Visit (HOSPITAL_COMMUNITY): Admitting: Internal Medicine
